# Patient Record
Sex: Male | Born: 1965 | Hispanic: No | Marital: Single | Smoking: Never smoker
Health system: Southern US, Community
[De-identification: ages and names within clinical notes are randomized; demographics above are authoritative.]

## PROBLEM LIST (undated history)

## (undated) ENCOUNTER — Ambulatory Visit: Admission: EM | Payer: Self-pay | Source: Home / Self Care

## (undated) DIAGNOSIS — G44229 Chronic tension-type headache, not intractable: Secondary | ICD-10-CM

## (undated) HISTORY — PX: NO PAST SURGERIES: SHX2092

## (undated) HISTORY — DX: Chronic tension-type headache, not intractable: G44.229

---

## 2007-08-15 ENCOUNTER — Ambulatory Visit: Payer: Self-pay | Admitting: Internal Medicine

## 2011-12-27 ENCOUNTER — Ambulatory Visit (INDEPENDENT_AMBULATORY_CARE_PROVIDER_SITE_OTHER): Payer: BC Managed Care – PPO | Admitting: Internal Medicine

## 2011-12-27 ENCOUNTER — Other Ambulatory Visit (INDEPENDENT_AMBULATORY_CARE_PROVIDER_SITE_OTHER): Payer: BC Managed Care – PPO

## 2011-12-27 ENCOUNTER — Encounter: Payer: Self-pay | Admitting: Internal Medicine

## 2011-12-27 VITALS — BP 120/72 | HR 49 | Temp 98.3°F | Ht 63.0 in | Wt 134.0 lb

## 2011-12-27 DIAGNOSIS — Z0000 Encounter for general adult medical examination without abnormal findings: Secondary | ICD-10-CM

## 2011-12-27 DIAGNOSIS — G44209 Tension-type headache, unspecified, not intractable: Secondary | ICD-10-CM

## 2011-12-27 LAB — CBC WITH DIFFERENTIAL/PLATELET
Basophils Relative: 0.6 % (ref 0.0–3.0)
Eosinophils Absolute: 0.1 10*3/uL (ref 0.0–0.7)
MCHC: 33.6 g/dL (ref 30.0–36.0)
MCV: 88 fl (ref 78.0–100.0)
Monocytes Absolute: 0.5 10*3/uL (ref 0.1–1.0)
Neutrophils Relative %: 50.9 % (ref 43.0–77.0)
RBC: 5.28 Mil/uL (ref 4.22–5.81)
RDW: 13.3 % (ref 11.5–14.6)

## 2011-12-27 LAB — BASIC METABOLIC PANEL
BUN: 15 mg/dL (ref 6–23)
CO2: 30 mEq/L (ref 19–32)
Chloride: 103 mEq/L (ref 96–112)
Creatinine, Ser: 1.1 mg/dL (ref 0.4–1.5)
Glucose, Bld: 106 mg/dL — ABNORMAL HIGH (ref 70–99)

## 2011-12-27 LAB — URINALYSIS, ROUTINE W REFLEX MICROSCOPIC
Hgb urine dipstick: NEGATIVE
Ketones, ur: NEGATIVE
Urine Glucose: NEGATIVE
Urobilinogen, UA: 0.2 (ref 0.0–1.0)

## 2011-12-27 LAB — HEPATIC FUNCTION PANEL
Albumin: 4.4 g/dL (ref 3.5–5.2)
Alkaline Phosphatase: 58 U/L (ref 39–117)
Bilirubin, Direct: 0.1 mg/dL (ref 0.0–0.3)
Total Bilirubin: 1.3 mg/dL — ABNORMAL HIGH (ref 0.3–1.2)

## 2011-12-27 LAB — LIPID PANEL
HDL: 43.2 mg/dL (ref >39.00)
Total CHOL/HDL Ratio: 4

## 2011-12-27 LAB — TSH: TSH: 1.62 u[IU]/mL (ref 0.35–5.50)

## 2011-12-27 LAB — PSA: PSA: 1.45 ng/mL (ref 0.10–4.00)

## 2011-12-27 NOTE — Patient Instructions (Signed)
It was good to see you today. We have reviewed your prior records including labs and tests today Health Maintenance reviewed - all recommended immunizations and age-appropriate screenings are up-to-date. Test(s) ordered today. Your results will be called to you after review (48-72hours after test completion). If any changes need to be made, you will be notified at that time. Please schedule followup in 1-2 years for medical physical and labs, call sooner if problems.  Preventive Care for Adults, Male A healthy lifestyle and preventative care can promote health and wellness. Preventative health guidelines for men include the following key practices:  A routine yearly physical is a good way to check with your caregiver about your health and preventative screening. It is a chance to share any concerns and updates on your health, and to receive a thorough exam.   Visit your dentist for a routine exam and preventative care every 6 months. Brush your teeth twice a day and floss once a day. Good oral hygiene prevents tooth decay and gum disease.   The frequency of eye exams is based on your age, health, family medical history, use of contact lenses, and other factors. Follow your caregiver's recommendations for frequency of eye exams.   Eat a healthy diet. Foods like vegetables, fruits, whole grains, low-fat dairy products, and lean protein foods contain the nutrients you need without too many calories. Decrease your intake of foods high in solid fats, added sugars, and salt. Eat the right amount of calories for you. Get information about a proper diet from your caregiver, if necessary.   Regular physical exercise is one of the most important things you can do for your health. Most adults should get at least 150 minutes of moderate-intensity exercise (any activity that increases your heart rate and causes you to sweat) each week. In addition, most adults need muscle-strengthening exercises on 2 or more  days a week.   Maintain a healthy weight. The body mass index (BMI) is a screening tool to identify possible weight problems. It provides an estimate of body fat based on height and weight. Your caregiver can help determine your BMI, and can help you achieve or maintain a healthy weight. For adults 20 years and older:   A BMI below 18.5 is considered underweight.   A BMI of 18.5 to 24.9 is normal.   A BMI of 25 to 29.9 is considered overweight.   A BMI of 30 and above is considered obese.   Maintain normal blood lipids and cholesterol levels by exercising and minimizing your intake of saturated fat. Eat a balanced diet with plenty of fruit and vegetables. Blood tests for lipids and cholesterol should begin at age 62 and be repeated every 5 years. If your lipid or cholesterol levels are high, you are over 50, or you are a high risk for heart disease, you may need your cholesterol levels checked more frequently. Ongoing high lipid and cholesterol levels should be treated with medicines if diet and exercise are not effective.   If you smoke, find out from your caregiver how to quit. If you do not use tobacco, do not start.   If you choose to drink alcohol, do not exceed 2 drinks per day. One drink is considered to be 12 ounces (355 mL) of beer, 5 ounces (148 mL) of wine, or 1.5 ounces (44 mL) of liquor.   Avoid use of street drugs. Do not share needles with anyone. Ask for help if you need support or instructions about  stopping the use of drugs.   High blood pressure causes heart disease and increases the risk of stroke. Your blood pressure should be checked at least every 1 to 2 years. Ongoing high blood pressure should be treated with medicines, if weight loss and exercise are not effective.   If you are 58 to 44 years old, ask your caregiver if you should take aspirin to prevent heart disease.   Diabetes screening involves taking a blood sample to check your fasting blood sugar level. This  should be done once every 3 years, after age 14, if you are within normal weight and without risk factors for diabetes. Testing should be considered at a younger age or be carried out more frequently if you are overweight and have at least 1 risk factor for diabetes.   Colorectal cancer can be detected and often prevented. Most routine colorectal cancer screening begins at the age of 11 and continues through age 63. However, your caregiver may recommend screening at an earlier age if you have risk factors for colon cancer. On a yearly basis, your caregiver may provide home test kits to check for hidden blood in the stool. Use of a small camera at the end of a tube, to directly examine the colon (sigmoidoscopy or colonoscopy), can detect the earliest forms of colorectal cancer. Talk to your caregiver about this at age 60, when routine screening begins.  Direct examination of the colon should be repeated every 5 to 10 years through age 1, unless early forms of pre-cancerous polyps or small growths are found.   Hepatitis C blood testing is recommended for all people born from [birth year] through 1965 and any individual with known risks for hepatitis C.   Practice safe sex. Use condoms and avoid high-risk sexual practices to reduce the spread of sexually transmitted infections (STIs). STIs include gonorrhea, chlamydia, syphilis, trichomonas, herpes, HPV, and human immunodeficiency virus (HIV). Herpes, HIV, and HPV are viral illnesses that have no cure. They can result in disability, cancer, and death.   A one-time screening for abdominal aortic aneurysm (AAA) and surgical repair of large AAAs by sound wave imaging (ultrasonography) is recommended for ages 101 to 70 years who are current or former smokers.   Healthy men should no longer receive prostate-specific antigen (PSA) blood tests as part of routine cancer screening. Consult with your caregiver about prostate cancer screening.   Testicular cancer screening is  not recommended for adult males who have no symptoms. Screening includes self-exam, caregiver exam, and other screening tests. Consult with your caregiver about any symptoms you have or any concerns you have about testicular cancer.   Use sunscreen with skin protection factor (SPF) of 30 or more. Apply sunscreen liberally and repeatedly throughout the day. You should seek shade when your shadow is shorter than you. Protect yourself by wearing long sleeves, pants, a wide-brimmed hat, and sunglasses year round, whenever you are outdoors.   Once a month, do a whole body skin exam, using a mirror to look at the skin on your back. Notify your caregiver of new moles, moles that have irregular borders, moles that are larger than a pencil eraser, or moles that have changed in shape or color.   Stay current with required immunizations.   Influenza. You need a dose every fall (or winter). The composition of the flu vaccine changes each year, so being vaccinated once is not enough.   Pneumococcal polysaccharide. You need 1 to 2 doses if you smoke cigarettes or  if you have certain chronic medical conditions. You need 1 dose at age 33 (or older) if you have never been vaccinated.   Tetanus, diphtheria, pertussis (Tdap, Td). Get 1 dose of Tdap vaccine if you are younger than age 91 years, are over 36 and have contact with an infant, are a Research scientist (physical sciences), or simply want to be protected from whooping cough. After that, you need a Td booster dose every 10 years. Consult your caregiver if you have not had at least 3 tetanus and diphtheria-containing shots sometime in your life or have a deep or dirty wound.   HPV. This vaccine is recommended for males 13 through 44 years of age. This vaccine may be given to men 22 through 44 years of age who have not completed the 3 dose series. It is recommended for men through age 38 who have sex with men or whose immune system is weakened because of HIV infection, other illness,  or medications. The vaccine is given in 3 doses over 6 months.   Measles, mumps, rubella (MMR). You need at least 1 dose of MMR if you were born in 1957 or later. You may also need a 2nd dose.   Meningococcal. If you are age 11 to 78 years and a Orthoptist living in a residence hall, or have one of several medical conditions, you need to get vaccinated against meningococcal disease. You may also need additional booster doses.   Zoster (shingles). If you are age 70 years or older, you should get this vaccine.   Varicella (chickenpox). If you have never had chickenpox or you were vaccinated but received only 1 dose, talk to your caregiver to find out if you need this vaccine.   Hepatitis A. You need this vaccine if you have a specific risk factor for hepatitis A virus infection, or you simply wish to be protected from this disease. The vaccine is usually given as 2 doses, 6 to 18 months apart.   Hepatitis B. You need this vaccine if you have a specific risk factor for hepatitis B virus infection or you simply wish to be protected from this disease. The vaccine is given in 3 doses, usually over 6 months.  Preventative Service / Frequency Ages 59 to 31  Blood pressure check.** / Every 1 to 2 years.   Lipid and cholesterol check.** / Every 5 years beginning at age 17.   Hepatitis C blood test.** / For any individual with known risks for hepatitis C.   Skin self-exam. / Monthly.   Influenza immunization.** / Every year.   Pneumococcal polysaccharide immunization.** / 1 to 2 doses if you smoke cigarettes or if you have certain chronic medical conditions.   Tetanus, diphtheria, pertussis (Tdap,Td) immunization. / A one-time dose of Tdap vaccine. After that, you need a Td booster dose every 10 years.   HPV immunization. / 3 doses over 6 months, if 26 and younger.   Measles, mumps, rubella (MMR) immunization. / You need at least 1 dose of MMR if you were born in 1957 or later.  You may also need a 2nd dose.   Meningococcal immunization. / 1 dose if you are age 48 to 54 years and a Orthoptist living in a residence hall, or have one of several medical conditions, you need to get vaccinated against meningococcal disease. You may also need additional booster doses.   Varicella immunization.** / Consult your caregiver.   Hepatitis A immunization.** / Consult your caregiver. 2  doses, 6 to 18 months apart.   Hepatitis B immunization.** / Consult your caregiver. 3 doses usually over 6 months.  Ages 76 to 22  Blood pressure check.** / Every 1 to 2 years.   Lipid and cholesterol check.** / Every 5 years beginning at age 28.   Fecal occult blood test (FOBT) of stool. / Every year beginning at age 30 and continuing until age 24. You may not have to do this test if you get colonoscopy every 10 years.   Flexible sigmoidoscopy** or colonoscopy.** / Every 5 years for a flexible sigmoidoscopy or every 10 years for a colonoscopy beginning at age 32 and continuing until age 68.   Hepatitis C blood test.** / For all people born from [birth year] through 1965 and any individual with known risks for hepatitis C.   Skin self-exam. / Monthly.   Influenza immunization.** / Every year.   Pneumococcal polysaccharide immunization.** / 1 to 2 doses if you smoke cigarettes or if you have certain chronic medical conditions.   Tetanus, diphtheria, pertussis (Tdap/Td) immunization.** / A one-time dose of Tdap vaccine. After that, you need a Td booster dose every 10 years.   Measles, mumps, rubella (MMR) immunization.  / You need at least 1 dose of MMR if you were born in 1957 or later. You may also need a 2nd dose.   Varicella immunization.**/ Consult your caregiver.   Meningococcal immunization.** / Consult your caregiver.   Hepatitis A immunization.** / Consult your caregiver. 2 doses, 6 to 18 months apart.   Hepatitis B immunization.** / Consult your caregiver. 3 doses,  usually over 6 months.  Ages 51 and over  Blood pressure check.** / Every 1 to 2 years.   Lipid and cholesterol check.**/ Every 5 years beginning at age 7.   Fecal occult blood test (FOBT) of stool. / Every year beginning at age 76 and continuing until age 59. You may not have to do this test if you get colonoscopy every 10 years.   Flexible sigmoidoscopy** or colonoscopy.** / Every 5 years for a flexible sigmoidoscopy or every 10 years for a colonoscopy beginning at age 52 and continuing until age 85.   Hepatitis C blood test.** / For all people born from [birth year] through 1965 and any individual with known risks for hepatitis C.   Abdominal aortic aneurysm (AAA) screening.** / A one-time screening for ages 24 to 65 years who are current or former smokers.   Skin self-exam. / Monthly.   Influenza immunization.** / Every year.   Pneumococcal polysaccharide immunization.** / 1 dose at age 18 (or older) if you have never been vaccinated.   Tetanus, diphtheria, pertussis (Tdap, Td) immunization. / A one-time dose of Tdap vaccine if you are over 65 and have contact with an infant, are a Research scientist (physical sciences), or simply want to be protected from whooping cough. After that, you need a Td booster dose every 10 years.   Varicella immunization. ** / Consult your caregiver.   Meningococcal immunization.** / Consult your caregiver.   Hepatitis A immunization. ** / Consult your caregiver. 2 doses, 6 to 18 months apart.   Hepatitis B immunization.** / Check with your caregiver. 3 doses, usually over 6 months.  **Family history and personal history of risk and conditions may change your caregiver's recommendations. Document Released: 08/08/2001 Document Revised: 06/01/2011 Document Reviewed: 11/07/2010 [facility] Patient Information [address], [place].

## 2011-12-27 NOTE — Progress Notes (Signed)
  Subjective:    Patient ID: Nathaniel Guzman, male    DOB: 1966-05-24, 46 y.o.   MRN: [id number]  HPI  New pt to me and our practice, here today to establish care patient is here today for annual physical. Patient feels well overall.  Concerned about occassional headaches - none present at this time Onset > 10 years ago ("after marriage") Head pain located posterior head Pain 2-3/10 at worst Describes as throbbing or pounding sensation Occurs on only weekends - coming home from work Friday PM relieved with sleep/nap  No fever, vision change, numbness, seizure or tenderness to touch No radiation of painNo hx remote or acute trauma  Past Medical History  Diagnosis Date  . Tension headache, chronic    Family History  Problem Relation Age of Onset  . Lung cancer Other    History  Substance Use Topics  . Smoking status: Never Smoker   . Smokeless tobacco: Not on file  . Alcohol Use: Yes    Review of Systems Constitutional: Negative for fever or weight change.  Respiratory: Negative for cough and shortness of breath.   Cardiovascular: Negative for chest pain or palpitations.  Gastrointestinal: Negative for abdominal pain, no bowel changes.  Musculoskeletal: Negative for gait problem or joint swelling.  Skin: Negative for rash.  Neurological: Negative for dizziness - occasional posterior headache on Fri PM, relieved with sleep.  No other specific complaints in a complete review of systems (except as listed in HPI above).     Objective:   Physical Exam BP 120/72  Pulse 49  Temp 98.3 F (36.8 C) (Oral)  Ht 5\' 3"  (1.6 m)  Wt 134 lb (60.782 kg)  BMI 23.74 kg/m2  SpO2 97% Wt Readings from Last 3 Encounters:  12/27/11 134 lb (60.782 kg)   Constitutional:  He is [place], appears well-developed and well-nourished. No distress.  HENT: NCAT, non tender sinus - OP clear, nares claer Eyes: PERRL, EOMI - no icterus Neck: Normal range of motion. Neck supple. No JVD or LAD present. No  thyromegaly present.  Cardiovascular: Normal rate, regular rhythm and normal heart sounds.  No murmur heard. no BLE edema Pulmonary/Chest: Effort normal and breath sounds normal. No respiratory distress. no wheezes.  Abdominal: Soft. Bowel sounds are normal. Patient exhibits no distension. There is no tenderness.  Musculoskeletal: Normal range of motion. Patient exhibits no gross deformities.  Neurological: he is alert and oriented to person, place, and time. No cranial nerve deficit. Coordination normal.  Skin: Skin is warm and dry.  No erythema or ulceration.  Psychiatric: he has a normal mood and affect. behavior is normal. Judgment and thought content normal.   No results found for this basename: WBC,  HGB,  HCT,  PLT,  GLUCOSE,  CHOL,  TRIG,  HDL,  LDLDIRECT,  LDLCALC,  ALT,  AST,  NA,  K,  CL,  CREATININE,  BUN,  CO2,  TSH,  PSA,  INR,  GLUF,  HGBA1C,  MICROALBUR       Assessment & Plan:  CPX/v70.0 - Patient has been counseled on age-appropriate routine health concerns for screening and prevention. These are reviewed and up-to-date. Immunizations are up-to-date or declined. Labs ordered and will be reviewed.  Tension headache, chronic - neuro exam and hx benign - reassurance provided - pt will call if symptoms worse or pattern of headache changes

## 2012-09-24 ENCOUNTER — Telehealth: Payer: Self-pay | Admitting: *Deleted

## 2012-09-24 DIAGNOSIS — Z125 Encounter for screening for malignant neoplasm of prostate: Secondary | ICD-10-CM

## 2012-09-24 DIAGNOSIS — Z0000 Encounter for general adult medical examination without abnormal findings: Secondary | ICD-10-CM

## 2012-09-24 NOTE — Telephone Encounter (Signed)
Message copied by Deatra James on Tue Sep 24, 2012  9:49 AM ------      Message from: Etheleen Sia      Created: Tue Sep 24, 2012  9:25 AM      Regarding: LAB       PHYSICAL IN [place] ------

## 2012-09-24 NOTE — Telephone Encounter (Signed)
Received staff msg pt made cpx for July. Entering cpx labs.Marland Kitchenlmb

## 2012-12-26 ENCOUNTER — Encounter: Payer: BC Managed Care – PPO | Admitting: Internal Medicine

## 2012-12-31 ENCOUNTER — Encounter: Payer: BC Managed Care – PPO | Admitting: Internal Medicine

## 2013-06-03 ENCOUNTER — Ambulatory Visit (INDEPENDENT_AMBULATORY_CARE_PROVIDER_SITE_OTHER): Payer: BC Managed Care – PPO | Admitting: Physician Assistant

## 2013-06-03 VITALS — BP 118/70 | HR 70 | Temp 98.6°F | Resp 16 | Ht 63.0 in | Wt 130.0 lb

## 2013-06-03 DIAGNOSIS — J40 Bronchitis, not specified as acute or chronic: Secondary | ICD-10-CM

## 2013-06-03 DIAGNOSIS — R059 Cough, unspecified: Secondary | ICD-10-CM

## 2013-06-03 DIAGNOSIS — J309 Allergic rhinitis, unspecified: Secondary | ICD-10-CM

## 2013-06-03 DIAGNOSIS — R05 Cough: Secondary | ICD-10-CM

## 2013-06-03 MED ORDER — MONTELUKAST SODIUM 10 MG PO TABS: 10.0000 mg | ORAL_TABLET | Freq: Every day | ORAL | Status: DC

## 2013-06-03 MED ORDER — PREDNISONE 20 MG PO TABS: ORAL_TABLET | ORAL | Status: DC

## 2013-06-03 MED ORDER — ALBUTEROL SULFATE HFA 108 (90 BASE) MCG/ACT IN AERS: 2.0000 | INHALATION_SPRAY | RESPIRATORY_TRACT | Status: DC | PRN

## 2013-06-03 MED ORDER — AZITHROMYCIN 250 MG PO TABS: ORAL_TABLET | ORAL | Status: DC

## 2013-06-03 MED ORDER — BENZONATATE 200 MG PO CAPS: 200.0000 mg | ORAL_CAPSULE | Freq: Three times a day (TID) | ORAL | Status: DC | PRN

## 2013-06-03 NOTE — Progress Notes (Signed)
Patient ID: Nathaniel Guzman MRN: [id number], DOB: August 20, 1965, 47 y.o. Date of Encounter: 06/03/2013, 5:40 PM  Primary Physician: Rene Paci, MD  Chief Complaint:  Chief Complaint  Patient presents with  . Cough    1 week  . Sore Throat    HPI: 47 y.o. male presents with a 7 day history of nasal congestion, post nasal drip, sore throat, and cough. Afebrile. No chills. Nasal congestion thick and green/yellow. Cough is productive of green/yellow sputum and not associated with time of day. No SOB or wheezing. Has tried OTC cold preps without success. No GI complaints. Appetite normal. Had a very similar cough in 2009 that was treated with Singulair successfully.    No sick contacts, recent antibiotics, or recent travels.   No leg trauma, sedentary periods, h/o cancer, or tobacco use.  Past Medical History  Diagnosis Date  . Tension headache, chronic      Home Meds: Prior to Admission medications   Medication Sig Start Date End Date Taking? Authorizing Provider  Multiple Vitamin (MULTIVITAMIN) tablet Take 1 tablet by mouth daily.   Yes Historical Provider, MD                                       Allergies:  Allergies  Allergen Reactions  . Hydrocodone Nausea And Vomiting  . Sulfa Antibiotics     History   Social History  . Marital Status: Single    Spouse Name: N/A    Number of Children: N/A  . Years of Education: N/A   Occupational History  . Not on file.   Social History Main Topics  . Smoking status: Never Smoker   . Smokeless tobacco: Not on file  . Alcohol Use: Yes  . Drug Use: No  . Sexual Activity: Not on file   Other Topics Concern  . Not on file   Social History Narrative  . No narrative on file     Review of Systems: Constitutional: positive for fatigue. negative for chills or fever HEENT: see above Cardiovascular: negative for chest pain or palpitations Respiratory: positive for cough. negative for wheezing, or shortness of  breath Abdominal: negative for abdominal pain, nausea, vomiting or diarrhea Dermatological: negative for rash Neurologic: negative for headache   Physical Exam: Blood pressure 118/70, pulse 70, temperature 98.6 F (37 C), resp. rate 16, height 5\' 3"  (1.6 m), weight 130 lb (58.968 kg), SpO2 96.00%., Body mass index is 23.03 kg/(m^2). General: Well developed, well nourished, in no acute distress. Head: Normocephalic, atraumatic, eyes without discharge, sclera non-icteric, nares are congested. Bilateral auditory canals clear, TM's are without perforation, pearly grey with reflective cone of light bilaterally. No sinus TTP. Oral cavity moist, dentition normal. Posterior pharynx with post nasal drip and mild erythema. No peritonsillar abscess or tonsillar exudate. Uvula midline.  Neck: Supple. No thyromegaly. Full ROM. No lymphadenopathy. Lungs: Coarse breath sounds bilaterally without wheezes, rales, or rhonchi. Breathing is unlabored.  Heart: RRR with S1 S2. No murmurs, rubs, or gallops appreciated. Msk:  Strength and tone normal for age. Extremities: No clubbing or cyanosis. No edema. Neuro: Alert and oriented X 3. Moves all extremities spontaneously. CNII-XII grossly in tact. Psych:  Responds to questions appropriately with a normal affect.     ASSESSMENT AND PLAN:  47 y.o. male with bronchitis, bronchospasm, cough, and allergies.  1) Bronchitis/bronchospasm/cough  -Azithromycin 250 MG #6 2 po first day  then 1 po next 4 days no RF -Prednisone 20 mg #18 3x3, 2x3, 1x3 no RF -Proventil 2 puffs inhaled q 4-6 hours prn #1 RF 1 -Tessalon Perles 200 mg 1 po tid prn cough #60 no RF  -Mucinex -Tylenol/Motrin prn -Rest/fluids -RTC precautions -RTC 3-5 days if no improvement  2) Allergies -Singulair 10 mg 1 po qhs #30 RF 5, this has helped him previously -Continue OTC Claritin   Signed, Eula Listen, PA-C [facility] [place], [place] [address] [phone] 06/03/2013 5:40  PM

## 2013-06-10 ENCOUNTER — Ambulatory Visit (INDEPENDENT_AMBULATORY_CARE_PROVIDER_SITE_OTHER): Payer: BC Managed Care – PPO | Admitting: Physician Assistant

## 2013-06-10 ENCOUNTER — Ambulatory Visit: Payer: BC Managed Care – PPO

## 2013-06-10 VITALS — BP 118/72 | HR 76 | Temp 98.1°F | Resp 18 | Ht 63.0 in | Wt 133.0 lb

## 2013-06-10 DIAGNOSIS — R05 Cough: Secondary | ICD-10-CM

## 2013-06-10 DIAGNOSIS — R059 Cough, unspecified: Secondary | ICD-10-CM

## 2013-06-10 DIAGNOSIS — K219 Gastro-esophageal reflux disease without esophagitis: Secondary | ICD-10-CM

## 2013-06-10 DIAGNOSIS — J387 Other diseases of larynx: Secondary | ICD-10-CM

## 2013-06-10 LAB — POCT CBC
Granulocyte percent: 59.5 %G (ref 37–80)
HCT, POC: 47 % (ref 43.5–53.7)
Hemoglobin: 14.6 g/dL (ref 14.1–18.1)
Lymph, poc: 3.7 — AB (ref 0.6–3.4)
MID (cbc): 0.6 (ref 0–0.9)
MPV: 8.9 fL (ref 0–99.8)
POC Granulocyte: 6.4 (ref 2–6.9)
POC MID %: 5.8 %M (ref 0–12)
Platelet Count, POC: 239 10*3/uL (ref 142–424)
RBC: 4.99 M/uL (ref 4.69–6.13)

## 2013-06-10 MED ORDER — ESOMEPRAZOLE MAGNESIUM 40 MG PO CPDR: 40.0000 mg | DELAYED_RELEASE_CAPSULE | Freq: Every day | ORAL | Status: DC

## 2013-06-10 MED ORDER — GUAIFENESIN-CODEINE 100-10 MG/5ML PO SOLN: ORAL | Status: DC

## 2013-06-10 NOTE — Progress Notes (Signed)
Patient ID: Nathaniel Guzman MRN: [id number], DOB: Feb 06, 1966, 47 y.o. Date of Encounter: 06/10/2013, 6:32 PM  Primary Physician: Rene Paci, MD  Chief Complaint: Follow up  HPI: 47 y.o. male with history below presents for follow up of cough. Patient initially presented on 06/03/13 with a 1 week history of productive cough. He was started on Z pack, prednisone 20 mg 3x3, 2x3, 1x3, Proventil, Tessalon, and Singulair. He states his cough is no better. Still coughing just as hard. Cough is sometimes productive and sometimes not productive. He sometimes feels like there is something along his throat/esophagus causing his cough. His cough is worse during the day and improves at nighttime. No SOB or wheezing. He denies any heartburn symptoms.    Past Medical History  Diagnosis Date  . Tension headache, chronic      Home Meds: Prior to Admission medications   Medication Sig Start Date End Date Taking? Authorizing Provider  montelukast (SINGULAIR) 10 MG tablet Take 1 tablet (10 mg total) by mouth at bedtime. 06/03/13  Yes Hoby Kawai M Jemari Hallum, PA-C  predniSONE (DELTASONE) 20 MG tablet 3 PO FOR 3 DAYS, 2 PO FOR 3 DAYS, 1 PO FOR 3 DAYS 06/03/13  Yes Laporsha Grealish M Lan Entsminger, PA-C  albuterol (PROVENTIL HFA;VENTOLIN HFA) 108 (90 BASE) MCG/ACT inhaler Inhale 2 puffs into the lungs every 4 (four) hours as needed for wheezing or shortness of breath. 06/03/13  No Luke Falero M Marlea Gambill, PA-C                Multiple Vitamin (MULTIVITAMIN) tablet Take 1 tablet by mouth daily.   No Historical Provider, MD    Allergies:  Allergies  Allergen Reactions  . Hydrocodone Nausea And Vomiting  . Sulfa Antibiotics     History   Social History  . Marital Status: Single    Spouse Name: N/A    Number of Children: N/A  . Years of Education: N/A   Occupational History  . Not on file.   Social History Main Topics  . Smoking status: Never Smoker   . Smokeless tobacco: Not on file  . Alcohol Use: Yes  . Drug Use: No  . Sexual Activity:  Not on file   Other Topics Concern  . Not on file   Social History Narrative  . No narrative on file     Review of Systems: Constitutional: negative for chills, fever, or fatigue  HEENT: positive for congestion and rhinorrhea. negative for vision changes, hearing loss, ST, or sinus pressure Cardiovascular: negative for chest pain or palpitations Respiratory: positive for cough. negative for wheezing, or shortness of breath Abdominal: negative for abdominal pain, reflux, nausea, vomiting, diarrhea, or constipation Dermatological: negative for rash Neurologic: negative for headache, dizziness, or syncope   Physical Exam: Blood pressure 118/72, pulse 76, temperature 98.1 F (36.7 C), temperature source Oral, resp. rate 18, height 5\' 3"  (1.6 m), weight 133 lb (60.328 kg), SpO2 98.00%., Body mass index is 23.57 kg/(m^2). General: Well developed, well nourished, in no acute distress. Head: Normocephalic, atraumatic, eyes without discharge, sclera non-icteric, nares are congested. Bilateral auditory canals clear, TM's are without perforation, pearly grey and translucent with reflective cone of light bilaterally. Oral cavity moist, posterior pharynx with post nasal drip. No exudate, erythema, or peritonsillar abscess. Uvula midline. Neck: Supple. No thyromegaly. Full ROM. No lymphadenopathy. Lungs: Clear bilaterally to auscultation without wheezes, rales, or rhonchi. Breathing is unlabored. Heart: RRR with S1 S2. No murmurs, rubs, or gallops appreciated. Msk:  Strength and tone normal for  age. Extremities/Skin: Warm and dry. No clubbing or cyanosis. No edema. No rashes or suspicious lesions. Neuro: Alert and oriented X 3. Moves all extremities spontaneously. Gait is normal. CNII-XII grossly in tact. Psych:  Responds to questions appropriately with a normal affect.   Labs: Results for orders placed in visit on 06/10/13  POCT CBC      Result Value Range   WBC 10.8 (*) 4.6 - 10.2 K/uL    Lymph, poc 3.7 (*) 0.6 - 3.4   POC LYMPH PERCENT 34.7  10 - 50 %L   MID (cbc) 0.6  0 - 0.9   POC MID % 5.8  0 - 12 %M   POC Granulocyte 6.4  2 - 6.9   Granulocyte percent 59.5  37 - 80 %G   RBC 4.99  4.69 - 6.13 M/uL   Hemoglobin 14.6  14.1 - 18.1 g/dL   HCT, POC 16.1  09.6 - 53.7 %   MCV 94.2  80 - 97 fL   MCH, POC 29.3  27 - 31.2 pg   MCHC 31.1 (*) 31.8 - 35.4 g/dL   RDW, POC 04.5     Platelet Count, POC 239  142 - 424 K/uL   MPV 8.9  0 - 99.8 fL   ACE pending  CXR:  [facility] reading (PRIMARY) by  Dr. Milus Glazier. Increased perihilar markings without discrete infiltrate     ASSESSMENT AND PLAN:  47 y.o. male with possible LPR and cough -Trial of Nexium 40 mg 1 po daily #30 RF 3 -Robitussin AC 1/2 tsp po q 4-6 hours prn cough #120 mL no RF, SED, Take on full stomach. Discussed intolerance  -Await lab -If symptoms persist plan to refer to pulmonology  -Discussed with Dr. Milus Glazier    Signed, Eula Listen, PA-C [facility] [place], [place] [address] [phone] 06/10/2013 6:32 PM

## 2013-06-11 LAB — COMPREHENSIVE METABOLIC PANEL
ALT: 16 U/L (ref 0–53)
AST: 16 U/L (ref 0–37)
Albumin: 4.3 g/dL (ref 3.5–5.2)
Alkaline Phosphatase: 67 U/L (ref 39–117)
BUN: 21 mg/dL (ref 6–23)
Calcium: 8.9 mg/dL (ref 8.4–10.5)
Chloride: 102 mEq/L (ref 96–112)
Potassium: 3.9 mEq/L (ref 3.5–5.3)
Sodium: 141 mEq/L (ref 135–145)
Total Protein: 7.1 g/dL (ref 6.0–8.3)

## 2013-06-11 LAB — ANGIOTENSIN CONVERTING ENZYME: Angiotensin-Converting Enzyme: 30 U/L (ref 8–52)

## 2013-10-27 ENCOUNTER — Other Ambulatory Visit (INDEPENDENT_AMBULATORY_CARE_PROVIDER_SITE_OTHER): Payer: BC Managed Care – PPO

## 2013-10-27 ENCOUNTER — Encounter: Payer: Self-pay | Admitting: Internal Medicine

## 2013-10-27 ENCOUNTER — Ambulatory Visit (INDEPENDENT_AMBULATORY_CARE_PROVIDER_SITE_OTHER): Payer: BC Managed Care – PPO | Admitting: Internal Medicine

## 2013-10-27 VITALS — BP 100/78 | HR 55 | Temp 99.0°F | Ht 63.0 in | Wt 136.8 lb

## 2013-10-27 DIAGNOSIS — Z0000 Encounter for general adult medical examination without abnormal findings: Secondary | ICD-10-CM

## 2013-10-27 LAB — BASIC METABOLIC PANEL
BUN: 16 mg/dL (ref 6–23)
CALCIUM: 9.2 mg/dL (ref 8.4–10.5)
CHLORIDE: 104 meq/L (ref 96–112)
CO2: 29 meq/L (ref 19–32)
CREATININE: 0.9 mg/dL (ref 0.4–1.5)
GFR: 96.47 mL/min (ref >60.00)
GLUCOSE: 97 mg/dL (ref 70–99)
Potassium: 3.9 mEq/L (ref 3.5–5.1)
Sodium: 140 mEq/L (ref 135–145)

## 2013-10-27 LAB — URINALYSIS, ROUTINE W REFLEX MICROSCOPIC
Bilirubin Urine: NEGATIVE
Hgb urine dipstick: NEGATIVE
Ketones, ur: NEGATIVE
Leukocytes, UA: NEGATIVE
Nitrite: NEGATIVE
PH: 6 (ref 5.0–8.0)
RBC / HPF: NONE SEEN (ref 0–?)
TOTAL PROTEIN, URINE-UPE24: NEGATIVE
URINE GLUCOSE: NEGATIVE
Urobilinogen, UA: 0.2 (ref 0.0–1.0)

## 2013-10-27 LAB — HEPATIC FUNCTION PANEL
ALBUMIN: 4.4 g/dL (ref 3.5–5.2)
ALT: 18 U/L (ref 0–53)
AST: 25 U/L (ref 0–37)
Alkaline Phosphatase: 57 U/L (ref 39–117)
BILIRUBIN TOTAL: 0.9 mg/dL (ref 0.3–1.2)
Bilirubin, Direct: 0.1 mg/dL (ref 0.0–0.3)
TOTAL PROTEIN: 7.1 g/dL (ref 6.0–8.3)

## 2013-10-27 LAB — CBC WITH DIFFERENTIAL/PLATELET
BASOS PCT: 0.6 % (ref 0.0–3.0)
Basophils Absolute: 0 10*3/uL (ref 0.0–0.1)
EOS ABS: 0.1 10*3/uL (ref 0.0–0.7)
Eosinophils Relative: 1.1 % (ref 0.0–5.0)
HCT: 45.4 % (ref 39.0–52.0)
Hemoglobin: 15.5 g/dL (ref 13.0–17.0)
Lymphocytes Relative: 33.9 % (ref 12.0–46.0)
Lymphs Abs: 1.9 10*3/uL (ref 0.7–4.0)
MCHC: 34.2 g/dL (ref 30.0–36.0)
MCV: 87 fl (ref 78.0–100.0)
MONO ABS: 0.4 10*3/uL (ref 0.1–1.0)
Monocytes Relative: 7.6 % (ref 3.0–12.0)
Neutro Abs: 3.1 10*3/uL (ref 1.4–7.7)
Neutrophils Relative %: 56.8 % (ref 43.0–77.0)
PLATELETS: 189 10*3/uL (ref 150.0–400.0)
RBC: 5.22 Mil/uL (ref 4.22–5.81)
RDW: 13.7 % (ref 11.5–14.6)
WBC: 5.5 10*3/uL (ref 4.5–10.5)

## 2013-10-27 LAB — PSA: PSA: 1.89 ng/mL (ref 0.10–4.00)

## 2013-10-27 LAB — LIPID PANEL
CHOLESTEROL: 182 mg/dL (ref 0–200)
HDL: 46.2 mg/dL (ref >39.00)
LDL CALC: 118 mg/dL — AB (ref 0–99)
TRIGLYCERIDES: 89 mg/dL (ref 0.0–149.0)
Total CHOL/HDL Ratio: 4
VLDL: 17.8 mg/dL (ref 0.0–40.0)

## 2013-10-27 LAB — TSH: TSH: 1.51 u[IU]/mL (ref 0.35–5.50)

## 2013-10-27 NOTE — Patient Instructions (Addendum)
It was good to see you today.  We have reviewed your prior records including labs and tests today  Health Maintenance reviewed - all recommended immunizations and age-appropriate screenings are up-to-date.  Test(s) ordered today. Your results will be released to MyChart (or called to you) after review, usually within 72hours after test completion. If any changes need to be made, you will be notified at that same time.  Medications reviewed and updated, no changes recommended at this time.  Please schedule followup in 12 months for annual exam and labs, call sooner if problems.  Health Maintenance, Males A healthy lifestyle and preventative care can promote health and wellness.  Maintain regular health, dental, and eye exams.  Eat a healthy diet. Foods like vegetables, fruits, whole grains, low-fat dairy products, and lean protein foods contain the nutrients you need and are low in calories. Decrease your intake of foods high in solid fats, added sugars, and salt. Get information about a proper diet from your health care provider, if necessary.  Regular physical exercise is one of the most important things you can do for your health. Most adults should get at least 150 minutes of moderate-intensity exercise (any activity that increases your heart rate and causes you to sweat) each week. In addition, most adults need muscle-strengthening exercises on 2 or more days a week.   Maintain a healthy weight. The body mass index (BMI) is a screening tool to identify possible weight problems. It provides an estimate of body fat based on height and weight. Your health care provider can find your BMI and can help you achieve or maintain a healthy weight. For males 20 years and older:  A BMI below 18.5 is considered underweight.  A BMI of 18.5 to 24.9 is normal.  A BMI of 25 to 29.9 is considered overweight.  A BMI of 30 and above is considered obese.  Maintain normal blood lipids and cholesterol  by exercising and minimizing your intake of saturated fat. Eat a balanced diet with plenty of fruits and vegetables. Blood tests for lipids and cholesterol should begin at age 20 and be repeated every 5 years. If your lipid or cholesterol levels are high, you are over 50, or you are at high risk for heart disease, you may need your cholesterol levels checked more frequently.Ongoing high lipid and cholesterol levels should be treated with medicines, if diet and exercise are not working.  If you smoke, find out from your health care provider how to quit. If you do not use tobacco, do not start.  Lung cancer screening is recommended for adults aged 55 80 years who are at high risk for developing lung cancer because of a history of smoking. A yearly low-dose CT scan of the lungs is recommended for people who have at least a 30-pack-year history of smoking and are a current smoker or have quit within the past 15 years. A pack year of smoking is smoking an average of 1 pack of cigarettes a day for 1 year (for example, a 30-pack-year history of smoking could mean smoking 1 pack a day for 30 years or 2 packs a day for 15 years). Yearly screening should continue until the smoker has stopped smoking for at least 15 years. Yearly screening should be stopped for people who develop a health problem that would prevent them from having lung cancer treatment.  If you choose to drink alcohol, do not have more than 2 drinks per day. One drink is considered to be   12 oz (360 mL) of beer, 5 oz (150 mL) of wine, or 1.5 oz (45 mL) of liquor.  Avoid use of street drugs. Do not share needles with anyone. Ask for help if you need support or instructions about stopping the use of drugs.  High blood pressure causes heart disease and increases the risk of stroke. Blood pressure should be checked at least every 1 2 years. Ongoing high blood pressure should be treated with medicines if weight loss and exercise are not effective.  If  you are 45 46 years old, ask your health care provider if you should take aspirin to prevent heart disease.  Diabetes screening involves taking a blood sample to check your fasting blood sugar level. This should be done once every 3 years after age 45, if you are at a normal weight and without risk factors for diabetes. Testing should be considered at a younger age or be carried out more frequently if you are overweight and have at least 1 risk factor for diabetes.  Colorectal cancer can be detected and often prevented. Most routine colorectal cancer screening begins at the age of 50 and continues through age 75. However, your health care provider may recommend screening at an earlier age if you have risk factors for colon cancer. On a yearly basis, your health care provider may provide home test kits to check for hidden blood in the stool. A small camera at the end of a tube may be used to directly examine the colon (sigmoidoscopy or colonoscopy) to detect the earliest forms of colorectal cancer. Talk to your health care provider about this at age 50, when routine screening begins. A direct exam of the colon should be repeated every 5 10 years through age 75, unless early forms of pre-cancerous polyps or small growths are found.  People who are at an increased risk for hepatitis B should be screened for this virus. You are considered at high risk for hepatitis B if:  You were born in a country where hepatitis B occurs often. Talk with your health care provider about which countries are considered high-risk.  Your parents were born in a high-risk country and you have not received a shot to protect against hepatitis B (hepatitis B vaccine).  You have HIV or AIDS.  You use needles to inject street drugs.  You live with, or have sex with, someone who has hepatitis B.  You are a man who has sex with other men (MSM).  You get hemodialysis treatment.  You take certain medicines for conditions like  cancer, organ transplantation, and autoimmune conditions.  Hepatitis C blood testing is recommended for all people born from 1945 through 1965 and any individual with known risk factors for hepatitis C.  Healthy men should no longer receive prostate-specific antigen (PSA) blood tests as part of routine cancer screening. Talk to your health care provider about prostate cancer screening.  Testicular cancer screening is not recommended for adolescents or adult males who have no symptoms. Screening includes self-exam, a health care provider exam, and other screening tests. Consult with your health care provider about any symptoms you have or any concerns you have about testicular cancer.  Practice safe sex. Use condoms and avoid high-risk sexual practices to reduce the spread of sexually transmitted infections (STIs).  Use sunscreen. Apply sunscreen liberally and repeatedly throughout the day. You should seek shade when your shadow is shorter than you. Protect yourself by wearing long sleeves, pants, a wide-brimmed hat, and sunglasses   year round, whenever you are outdoors.  Tell your health care provider of new moles or changes in moles, especially if there is a change in shape or color. Also tell your provider if a mole is larger than the size of a pencil eraser.  A one-time screening for abdominal aortic aneurysm (AAA) and surgical repair of large AAAs by ultrasound is recommended for men aged 65 75 years who are current or former smokers.  Stay current with your vaccines (immunizations). Document Released: 12/09/2007 Document Revised: 04/02/2013 Document Reviewed: 11/07/2010 [employer] Patient Information 2014 [employer].  

## 2013-10-27 NOTE — Progress Notes (Signed)
Pre visit review using our clinic review tool, if applicable. No additional management support is needed unless otherwise documented below in the visit note. 

## 2013-10-27 NOTE — Progress Notes (Signed)
   Subjective:    Patient ID: Nathaniel BoughHankyu Uppal, male    DOB: 1965/09/23, 48 y.o.   MRN: [id number]  HPI  patient is here today for annual physical. Patient feels well and has no complaints. Also reviewed chronic medical issues and interval medical events  Past Medical History  Diagnosis Date  . Tension headache, chronic    Family History  Problem Relation Age of Onset  . Lung cancer Cousin    History  Substance Use Topics  . Smoking status: Never Smoker   . Smokeless tobacco: Not on file  . Alcohol Use: Yes    Review of Systems  Constitutional: Positive for fatigue. Negative for fever, activity change, appetite change and unexpected weight change.  Respiratory: Negative for cough, chest tightness, shortness of breath and wheezing.   Cardiovascular: Negative for chest pain, palpitations and leg swelling.  Neurological: Negative for dizziness, weakness and headaches.  Psychiatric/Behavioral: Negative for dysphoric mood. The patient is not nervous/anxious.   All other systems reviewed and are negative.      Objective:   Physical Exam  BP 100/78  Pulse 55  Temp(Src) 99 F (37.2 C) (Oral)  Ht 5\' 3"  (1.6 m)  Wt 136 lb 12.8 oz (62.052 kg)  BMI 24.24 kg/m2  SpO2 97% Wt Readings from Last 3 Encounters:  10/27/13 136 lb 12.8 oz (62.052 kg)  06/10/13 133 lb (60.328 kg)  06/03/13 130 lb (58.968 kg)   Constitutional: he appears well-developed and well-nourished. No distress.  HENT: Head: Normocephalic and atraumatic. Ears: B TMs ok, no erythema or effusion; Nose: Nose normal. Mouth/Throat: Oropharynx is clear and moist. No oropharyngeal exudate.  Eyes: Conjunctivae and EOM are normal. Pupils are equal, round, and reactive to light. No scleral icterus.  Neck: Normal range of motion. Neck supple. No JVD present. No thyromegaly present.  Cardiovascular: Normal rate, regular rhythm and normal heart sounds.  No murmur heard. No BLE edema. Pulmonary/Chest: Effort normal and breath sounds  normal. No respiratory distress. he has no wheezes.  Abdominal: Soft. Bowel sounds are normal. he exhibits no distension. There is no tenderness. no masses Musculoskeletal: Normal range of motion, no joint effusions. No gross deformities Neurological: he is alert and oriented to person, place, and time. No cranial nerve deficit. Coordination, balance, strength, speech and gait are normal.  Skin: Skin is warm and dry. No rash noted. No erythema.  Psychiatric: he has a normal mood and affect. behavior is normal. Judgment and thought content normal.  Lab Results  Component Value Date   WBC 10.8* 06/10/2013   HGB 14.6 06/10/2013   HCT 47.0 06/10/2013   PLT 167.0 12/27/2011   GLUCOSE 96 06/10/2013   CHOL 184 12/27/2011   TRIG 159.0* 12/27/2011   HDL 43.20 12/27/2011   LDLCALC 109* 12/27/2011   ALT 16 06/10/2013   AST 16 06/10/2013   NA 141 06/10/2013   K 3.9 06/10/2013   CL 102 06/10/2013   CREATININE 1.01 06/10/2013   BUN 21 06/10/2013   CO2 31 06/10/2013   TSH 1.62 12/27/2011   PSA 1.45 12/27/2011   ECG: Normal sinus rhythm at 59 beats per minute. No ischemic change or arrhythmia No results found.     Assessment & Plan:   CPX/v70.0 - Patient has been counseled on age-appropriate routine health concerns for screening and prevention. These are reviewed and up-to-date. Immunizations are up-to-date or declined. Labs/ECG ordered and reviewed.

## 2014-06-24 LAB — HEPATIC FUNCTION PANEL
ALT: 13 U/L (ref 10–40)
AST: 18 U/L (ref 14–40)
Alkaline Phosphatase: 48 U/L (ref 25–125)
BILIRUBIN, TOTAL: 1 mg/dL

## 2014-06-24 LAB — HM COLONOSCOPY: HM Colonoscopy: 2014

## 2014-06-24 LAB — BASIC METABOLIC PANEL
BUN: 1 mg/dL — AB (ref 4–21)
Creatinine: 7 mg/dL — AB (ref 0.6–1.3)
Glucose: 80 mg/dL

## 2014-06-24 LAB — LIPID PANEL
Cholesterol: 174 mg/dL (ref 0–200)
HDL: 39 mg/dL (ref 35–70)
LDL CALC: 100 mg/dL
LDl/HDL Ratio: 4.5
Triglycerides: 174 mg/dL — AB (ref 40–160)

## 2014-11-04 ENCOUNTER — Encounter: Payer: BC Managed Care – PPO | Admitting: Internal Medicine

## 2014-12-31 IMAGING — CR DG CHEST 2V
2 series · 2 of 2 positions shown · non-contrast
Comparison: None.

CLINICAL DATA: Cough

EXAM:
CHEST  2 VIEW

[PA]
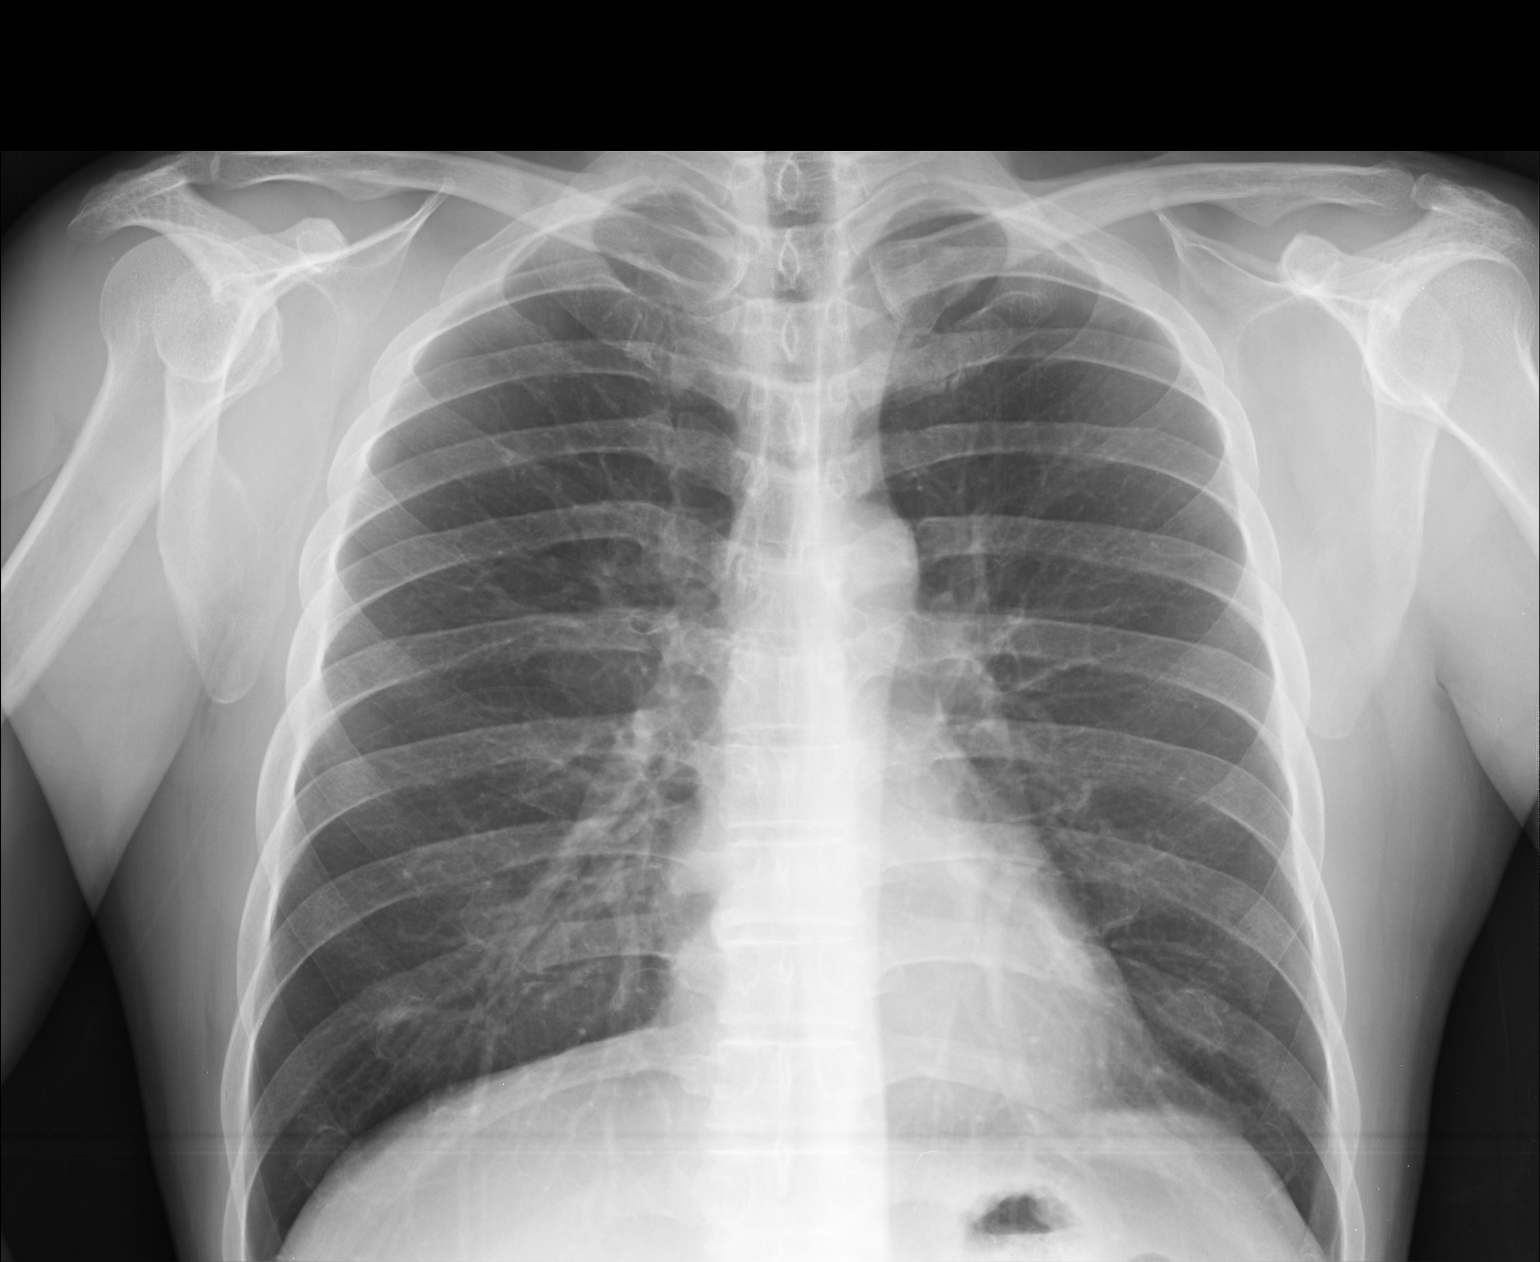

[lateral]
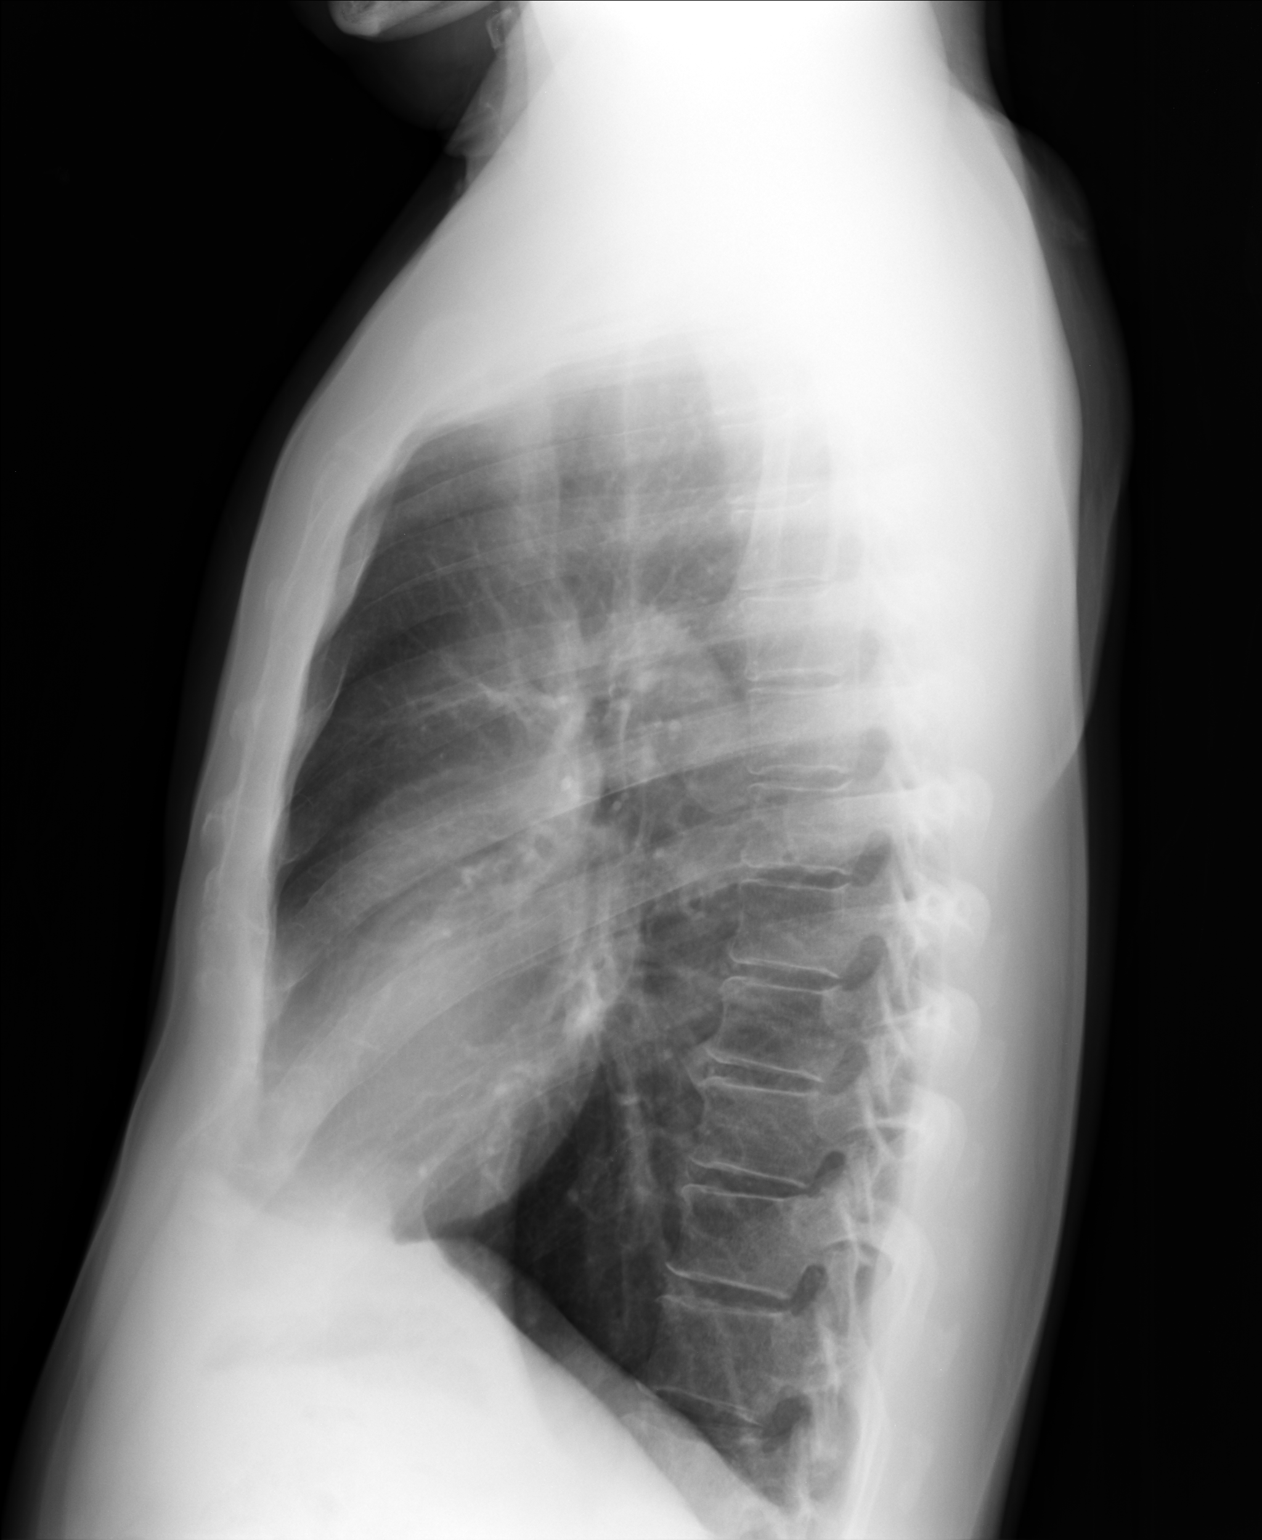

[2 of 2 positions shown; findings below may reference images not displayed]

FINDINGS: The heart size and mediastinal contours are within normal limits.
Both lungs are clear. The visualized skeletal structures are
unremarkable.
IMPRESSION: No active cardiopulmonary disease.

## 2015-02-18 ENCOUNTER — Ambulatory Visit (INDEPENDENT_AMBULATORY_CARE_PROVIDER_SITE_OTHER): Payer: 59 | Admitting: Internal Medicine

## 2015-02-18 ENCOUNTER — Encounter: Payer: Self-pay | Admitting: Internal Medicine

## 2015-02-18 ENCOUNTER — Other Ambulatory Visit (INDEPENDENT_AMBULATORY_CARE_PROVIDER_SITE_OTHER): Payer: 59

## 2015-02-18 VITALS — BP 110/80 | HR 64 | Temp 98.2°F | Ht 63.0 in | Wt 137.8 lb

## 2015-02-18 DIAGNOSIS — B354 Tinea corporis: Secondary | ICD-10-CM

## 2015-02-18 DIAGNOSIS — Z0000 Encounter for general adult medical examination without abnormal findings: Secondary | ICD-10-CM | POA: Diagnosis not present

## 2015-02-18 DIAGNOSIS — R0781 Pleurodynia: Secondary | ICD-10-CM

## 2015-02-18 LAB — LIPID PANEL
Cholesterol: 172 mg/dL (ref 0–200)
HDL: 35.3 mg/dL — ABNORMAL LOW (ref >39.00)
LDL CALC: 102 mg/dL — AB (ref 0–99)
NONHDL: 136.39
Total CHOL/HDL Ratio: 5
Triglycerides: 171 mg/dL — ABNORMAL HIGH (ref 0.0–149.0)
VLDL: 34.2 mg/dL (ref 0.0–40.0)

## 2015-02-18 LAB — HEPATIC FUNCTION PANEL
ALBUMIN: 4.6 g/dL (ref 3.5–5.2)
ALK PHOS: 65 U/L (ref 39–117)
ALT: 16 U/L (ref 0–53)
AST: 18 U/L (ref 0–37)
Bilirubin, Direct: 0.1 mg/dL (ref 0.0–0.3)
Total Bilirubin: 0.5 mg/dL (ref 0.2–1.2)
Total Protein: 7.3 g/dL (ref 6.0–8.3)

## 2015-02-18 LAB — BASIC METABOLIC PANEL
BUN: 17 mg/dL (ref 6–23)
CALCIUM: 9.5 mg/dL (ref 8.4–10.5)
CO2: 31 meq/L (ref 19–32)
Chloride: 103 mEq/L (ref 96–112)
Creatinine, Ser: 1.06 mg/dL (ref 0.40–1.50)
GFR: 79.42 mL/min (ref >60.00)
GLUCOSE: 101 mg/dL — AB (ref 70–99)
Potassium: 4.1 mEq/L (ref 3.5–5.1)
SODIUM: 140 meq/L (ref 135–145)

## 2015-02-18 LAB — URINALYSIS, ROUTINE W REFLEX MICROSCOPIC
Bilirubin Urine: NEGATIVE
Hgb urine dipstick: NEGATIVE
Ketones, ur: NEGATIVE
LEUKOCYTES UA: NEGATIVE
Nitrite: NEGATIVE
RBC / HPF: NONE SEEN (ref 0–?)
SPECIFIC GRAVITY, URINE: 1.015 (ref 1.000–1.030)
TOTAL PROTEIN, URINE-UPE24: NEGATIVE
URINE GLUCOSE: NEGATIVE
Urobilinogen, UA: 0.2 (ref 0.0–1.0)
WBC, UA: NONE SEEN (ref 0–?)
pH: 5.5 (ref 5.0–8.0)

## 2015-02-18 LAB — TSH: TSH: 2.25 u[IU]/mL (ref 0.35–4.50)

## 2015-02-18 LAB — CBC WITH DIFFERENTIAL/PLATELET
BASOS ABS: 0 10*3/uL (ref 0.0–0.1)
Basophils Relative: 0.4 % (ref 0.0–3.0)
Eosinophils Absolute: 0.1 10*3/uL (ref 0.0–0.7)
Eosinophils Relative: 1.7 % (ref 0.0–5.0)
HCT: 47 % (ref 39.0–52.0)
Hemoglobin: 16.1 g/dL (ref 13.0–17.0)
LYMPHS ABS: 2 10*3/uL (ref 0.7–4.0)
Lymphocytes Relative: 26.1 % (ref 12.0–46.0)
MCHC: 34.3 g/dL (ref 30.0–36.0)
MCV: 86.8 fl (ref 78.0–100.0)
MONOS PCT: 7.3 % (ref 3.0–12.0)
Monocytes Absolute: 0.6 10*3/uL (ref 0.1–1.0)
NEUTROS ABS: 5 10*3/uL (ref 1.4–7.7)
NEUTROS PCT: 64.5 % (ref 43.0–77.0)
PLATELETS: 184 10*3/uL (ref 150.0–400.0)
RBC: 5.42 Mil/uL (ref 4.22–5.81)
RDW: 13.3 % (ref 11.5–15.5)
WBC: 7.8 10*3/uL (ref 4.0–10.5)

## 2015-02-18 LAB — PSA: PSA: 1.51 ng/mL (ref 0.10–4.00)

## 2015-02-18 MED ORDER — CLOTRIMAZOLE-BETAMETHASONE 1-0.05 % EX CREA
1.0000 | TOPICAL_CREAM | Freq: Two times a day (BID) | CUTANEOUS | Status: DC | PRN
Start: 2015-02-18 — End: 2016-02-29

## 2015-02-18 NOTE — Addendum Note (Signed)
Addended by: Tonye Becket on: 02/18/2015 09:52 AM   Modules accepted: Kipp Brood

## 2015-02-18 NOTE — Progress Notes (Signed)
Subjective:    Patient ID: Nathaniel Guzman, male    DOB: [birth year], 49 y.o.   MRN: [id number]  HPI  patient is here today for annual physical. Patient feels well and has no complaints. Also reviewed chronic medical conditions, interval events and current concerns  Past Medical History  Diagnosis Date  . Tension headache, chronic    Family History  Problem Relation Age of Onset  . Lung cancer Cousin    Social History  Substance Use Topics  . Smoking status: Never Smoker   . Smokeless tobacco: None  . Alcohol Use: Yes    Review of Systems  Constitutional: Negative for fever, activity change, appetite change, fatigue and unexpected weight change.  Respiratory: Negative for cough, chest tightness, shortness of breath and wheezing.   Cardiovascular: Negative for chest pain, palpitations and leg swelling.  Gastrointestinal: Positive for abdominal pain (L costal edge of ribs x 1 mo after extended stretch during tennis serve action - feels bruised to touch ). Negative for nausea, diarrhea, constipation and blood in stool.  Skin: Positive for rash (R ant thigh since 07/2014 - darkened skin patch, occ itch - no ulcer or recollection of injury/contact - no spread).  Neurological: Negative for dizziness, weakness and headaches.  Psychiatric/Behavioral: Negative for dysphoric mood. The patient is not nervous/anxious.   All other systems reviewed and are negative.      Objective:    Physical Exam  Constitutional: He is oriented to person, place, and time. He appears well-developed and well-nourished. No distress.  HENT:  Head: Normocephalic and atraumatic.  Nose: Nose normal.  Mouth/Throat: Oropharynx is clear and moist.  Hearing grossly normal.  Eyes: Conjunctivae and EOM are normal. Pupils are equal, round, and reactive to light. No scleral icterus.  Neck: Normal range of motion. Neck supple. No JVD present. No thyromegaly present.  Cardiovascular: Normal rate, regular rhythm, normal  heart sounds and intact distal pulses.  Exam reveals no friction rub.   No murmur heard. No edema.  Pulmonary/Chest: Effort normal and breath sounds normal. No respiratory distress. He has no wheezes.  Abdominal: Soft. Bowel sounds are normal. He exhibits no distension and no mass. Tenderness: specific point tenderness over false left rib margin , reproducible with pressure. There is no guarding.  Genitourinary:  defer  Musculoskeletal: Normal range of motion. He exhibits no edema or tenderness.  Lymphadenopathy:    He has no cervical adenopathy.  Neurological: He is alert and oriented to person, place, and time. He has normal reflexes. No cranial nerve deficit.  Skin: Skin is warm and dry. No rash noted. No erythema.  1" darkening skin patch consistent with tinea corporis on right anterior thigh  Psychiatric: He has a normal mood and affect. His behavior is normal. Thought content normal.    BP 110/80 mmHg  Pulse 64  Temp(Src) 98.2 F (36.8 C) (Oral)  Ht 5\' 3"  (1.6 m)  Wt 137 lb 12 oz (62.483 kg)  BMI 24.41 kg/m2  SpO2 96% Wt Readings from Last 3 Encounters:  02/18/15 137 lb 12 oz (62.483 kg)  10/27/13 136 lb 12.8 oz (62.052 kg)  06/10/13 133 lb (60.328 kg)    Lab Results  Component Value Date   WBC 5.5 10/27/2013   HGB 15.5 10/27/2013   HCT 45.4 10/27/2013   PLT 189.0 10/27/2013   GLUCOSE 97 10/27/2013   CHOL 174 06/24/2014   TRIG 174* 06/24/2014   HDL 39 06/24/2014   LDLCALC 100 06/24/2014   ALT 13  06/24/2014   AST 18 06/24/2014   NA 140 10/27/2013   K 3.9 10/27/2013   CL 104 10/27/2013   CREATININE 7.0* 06/24/2014   BUN 1* 06/24/2014   CO2 29 10/27/2013   TSH 1.51 10/27/2013   PSA 1.89 10/27/2013    No results found.     Assessment & Plan:   CPX/z00.00 - Patient has been counseled on age-appropriate routine health concerns for screening and prevention. These are reviewed and up-to-date. Immunizations are up-to-date or declined. Labs ordered and  reviewed.  Tinea corporis. Lotrisone topical cream prescribed. Education and reassurance provided. for dermatology evaluation if worse or unimproved  Rib contusion versus chest wall muscle tear. One-month duration of tenderness over left lateral rib cage following extended stretch with tennis serve action. Will arrange evaluation by sports medicine for ultrasound of this area. Advised conservative care such as anti-inflammatory, Tylenol and ice as needed  Problem List Items Addressed This Visit    None    Visit Diagnoses    Routine general medical examination at a health care facility    -  Primary    Relevant Orders    Basic metabolic panel    CBC with Differential/Platelet    Hepatic function panel    Lipid panel    TSH    Urinalysis, Routine w reflex microscopic (not at [facility])    PSA    Tinea corporis        Relevant Medications    clotrimazole-betamethasone (LOTRISONE) cream    Rib pain on left side            Rene Paci, MD

## 2015-02-18 NOTE — Progress Notes (Signed)
Pre visit review using our clinic review tool, if applicable. No additional management support is needed unless otherwise documented below in the visit note. 

## 2015-02-18 NOTE — Patient Instructions (Addendum)
It was good to see you today.  We have reviewed your prior records including labs and tests today  Health Maintenance reviewed - flu shot due every fall. All other recommended immunizations and age-appropriate screenings are up-to-date.  Test(s) ordered today. Your results will be released to MyChart (or called to you) after review, usually within 72hours after test completion. If any changes need to be made, you will be notified at that same time.  Medications reviewed and updated Use prescription generic Lotrisone for skin rash. Please let us know if unimproved, sooner if worse, for referral to dermatology for additional evaluation and treatment as needed No other prescription changes recommended at this time.  Your prescription(s) have been submitted to your pharmacy. Please take as directed and contact our office if you believe you are having problem(s) with the medication(s).  We'll arrange evaluation by Dr. Katrinka Blazing, sports medicine specialist for injury related to tenderness over left rib cage  Please schedule followup in 12 months for annual exam and labs, call sooner if problems.  Health Maintenance A healthy lifestyle and preventative care can promote health and wellness.  Maintain regular health, dental, and eye exams.  Eat a healthy diet. Foods like vegetables, fruits, whole grains, low-fat dairy products, and lean protein foods contain the nutrients you need and are low in calories. Decrease your intake of foods high in solid fats, added sugars, and salt. Get information about a proper diet from your health care provider, if necessary.  Regular physical exercise is one of the most important things you can do for your health. Most adults should get at least 150 minutes of moderate-intensity exercise (any activity that increases your heart rate and causes you to sweat) each week. In addition, most adults need muscle-strengthening exercises on 2 or more days a week.   Maintain a  healthy weight. The body mass index (BMI) is a screening tool to identify possible weight problems. It provides an estimate of body fat based on height and weight. Your health care provider can find your BMI and can help you achieve or maintain a healthy weight. For males 20 years and older:  A BMI below 18.5 is considered underweight.  A BMI of 18.5 to 24.9 is normal.  A BMI of 25 to 29.9 is considered overweight.  A BMI of 30 and above is considered obese.  Maintain normal blood lipids and cholesterol by exercising and minimizing your intake of saturated fat. Eat a balanced diet with plenty of fruits and vegetables. Blood tests for lipids and cholesterol should begin at age 81 and be repeated every 5 years. If your lipid or cholesterol levels are high, you are over age 80, or you are at high risk for heart disease, you may need your cholesterol levels checked more frequently.Ongoing high lipid and cholesterol levels should be treated with medicines if diet and exercise are not working.  If you smoke, find out from your health care provider how to quit. If you do not use tobacco, do not start.  Lung cancer screening is recommended for adults aged 55-80 years who are at high risk for developing lung cancer because of a history of smoking. A yearly low-dose CT scan of the lungs is recommended for people who have at least a 30-pack-year history of smoking and are current smokers or have quit within the past 15 years. A pack year of smoking is smoking an average of 1 pack of cigarettes a day for 1 year (for example, a 30-pack-year  history of smoking could mean smoking 1 pack a day for 30 years or 2 packs a day for 15 years). Yearly screening should continue until the smoker has stopped smoking for at least 15 years. Yearly screening should be stopped for people who develop a health problem that would prevent them from having lung cancer treatment.  If you choose to drink alcohol, do not have more than  2 drinks per day. One drink is considered to be 12 oz (360 mL) of beer, 5 oz (150 mL) of wine, or 1.5 oz (45 mL) of liquor.  Avoid the use of street drugs. Do not share needles with anyone. Ask for help if you need support or instructions about stopping the use of drugs.  High blood pressure causes heart disease and increases the risk of stroke. Blood pressure should be checked at least every 1-2 years. Ongoing high blood pressure should be treated with medicines if weight loss and exercise are not effective.  If you are 76-72 years old, ask your health care provider if you should take aspirin to prevent heart disease.  Diabetes screening involves taking a blood sample to check your fasting blood sugar level. This should be done once every 3 years after age 7 if you are at a normal weight and without risk factors for diabetes. Testing should be considered at a younger age or be carried out more frequently if you are overweight and have at least 1 risk factor for diabetes.  Colorectal cancer can be detected and often prevented. Most routine colorectal cancer screening begins at the age of 61 and continues through age 63. However, your health care provider may recommend screening at an earlier age if you have risk factors for colon cancer. On a yearly basis, your health care provider may provide home test kits to check for hidden blood in the stool. A small camera at the end of a tube may be used to directly examine the colon (sigmoidoscopy or colonoscopy) to detect the earliest forms of colorectal cancer. Talk to your health care provider about this at age 40 when routine screening begins. A direct exam of the colon should be repeated every 5-10 years through age 52, unless early forms of precancerous polyps or small growths are found.  People who are at an increased risk for hepatitis B should be screened for this virus. You are considered at high risk for hepatitis B if:  You were born in a country  where hepatitis B occurs often. Talk with your health care provider about which countries are considered high risk.  Your parents were born in a high-risk country and you have not received a shot to protect against hepatitis B (hepatitis B vaccine).  You have HIV or AIDS.  You use needles to inject street drugs.  You live with, or have sex with, someone who has hepatitis B.  You are a man who has sex with other men (MSM).  You get hemodialysis treatment.  You take certain medicines for conditions like cancer, organ transplantation, and autoimmune conditions.  Hepatitis C blood testing is recommended for all people born from [birth year] through 1963 and any individual with known risk factors for hepatitis C.  Healthy men should no longer receive prostate-specific antigen (PSA) blood tests as part of routine cancer screening. Talk to your health care provider about prostate cancer screening.  Testicular cancer screening is not recommended for adolescents or adult males who have no symptoms. Screening includes self-exam, a health care provider  exam, and other screening tests. Consult with your health care provider about any symptoms you have or any concerns you have about testicular cancer.  Practice safe sex. Use condoms and avoid high-risk sexual practices to reduce the spread of sexually transmitted infections (STIs).  You should be screened for STIs, including gonorrhea and chlamydia if:  You are sexually active and are younger than 24 years.  You are older than 24 years, and your health care provider tells you that you are at risk for this type of infection.  Your sexual activity has changed since you were last screened, and you are at an increased risk for chlamydia or gonorrhea. Ask your health care provider if you are at risk.  If you are at risk of being infected with HIV, it is recommended that you take a prescription medicine daily to prevent HIV infection. This is called  pre-exposure prophylaxis (PrEP). You are considered at risk if:  You are a man who has sex with other men (MSM).  You are a heterosexual man who is sexually active with multiple partners.  You take drugs by injection.  You are sexually active with a partner who has HIV.  Talk with your health care provider about whether you are at high risk of being infected with HIV. If you choose to begin PrEP, you should first be tested for HIV. You should then be tested every 3 months for as long as you are taking PrEP.  Use sunscreen. Apply sunscreen liberally and repeatedly throughout the day. You should seek shade when your shadow is shorter than you. Protect yourself by wearing long sleeves, pants, a wide-brimmed hat, and sunglasses year round whenever you are outdoors.  Tell your health care provider of new moles or changes in moles, especially if there is a change in shape or color. Also, tell your health care provider if a mole is larger than the size of a pencil eraser.  A one-time screening for abdominal aortic aneurysm (AAA) and surgical repair of large AAAs by ultrasound is recommended for men aged 65-75 years who are current or former smokers.  Stay current with your vaccines (immunizations). Document Released: 12/09/2007 Document Revised: 06/17/2013 Document Reviewed: 11/07/2010 [facility] Patient Information 2015 [place], [place]. This information is not intended to replace advice given to you by your health care provider. Make sure you discuss any questions you have with your health care provider.

## 2015-03-09 ENCOUNTER — Ambulatory Visit: Payer: 59 | Admitting: Family Medicine

## 2016-02-21 ENCOUNTER — Ambulatory Visit: Payer: 59 | Admitting: Internal Medicine

## 2016-02-29 ENCOUNTER — Ambulatory Visit (INDEPENDENT_AMBULATORY_CARE_PROVIDER_SITE_OTHER): Payer: 59 | Admitting: Nurse Practitioner

## 2016-02-29 ENCOUNTER — Encounter: Payer: Self-pay | Admitting: Nurse Practitioner

## 2016-02-29 ENCOUNTER — Other Ambulatory Visit (INDEPENDENT_AMBULATORY_CARE_PROVIDER_SITE_OTHER): Payer: 59

## 2016-02-29 VITALS — BP 112/74 | HR 55 | Temp 98.5°F | Ht 64.0 in | Wt 135.0 lb

## 2016-02-29 DIAGNOSIS — B354 Tinea corporis: Secondary | ICD-10-CM | POA: Insufficient documentation

## 2016-02-29 DIAGNOSIS — R6889 Other general symptoms and signs: Secondary | ICD-10-CM

## 2016-02-29 DIAGNOSIS — Z0001 Encounter for general adult medical examination with abnormal findings: Secondary | ICD-10-CM

## 2016-02-29 DIAGNOSIS — Z0000 Encounter for general adult medical examination without abnormal findings: Secondary | ICD-10-CM

## 2016-02-29 LAB — LIPID PANEL
Cholesterol: 172 mg/dL (ref 0–200)
HDL: 41.2 mg/dL (ref >39.00)
LDL Cholesterol: 101 mg/dL — ABNORMAL HIGH (ref 0–99)
NONHDL: 130.76
Total CHOL/HDL Ratio: 4
Triglycerides: 149 mg/dL (ref 0.0–149.0)
VLDL: 29.8 mg/dL (ref 0.0–40.0)

## 2016-02-29 LAB — CBC WITH DIFFERENTIAL/PLATELET
BASOS ABS: 0 10*3/uL (ref 0.0–0.1)
Basophils Relative: 0.5 % (ref 0.0–3.0)
Eosinophils Absolute: 0.2 10*3/uL (ref 0.0–0.7)
Eosinophils Relative: 2.5 % (ref 0.0–5.0)
HCT: 44.5 % (ref 39.0–52.0)
Hemoglobin: 15.5 g/dL (ref 13.0–17.0)
LYMPHS ABS: 1.9 10*3/uL (ref 0.7–4.0)
Lymphocytes Relative: 29.3 % (ref 12.0–46.0)
MCHC: 34.8 g/dL (ref 30.0–36.0)
MCV: 85 fl (ref 78.0–100.0)
MONO ABS: 0.5 10*3/uL (ref 0.1–1.0)
MONOS PCT: 7.6 % (ref 3.0–12.0)
NEUTROS ABS: 3.9 10*3/uL (ref 1.4–7.7)
NEUTROS PCT: 60.1 % (ref 43.0–77.0)
PLATELETS: 174 10*3/uL (ref 150.0–400.0)
RBC: 5.24 Mil/uL (ref 4.22–5.81)
RDW: 13.3 % (ref 11.5–15.5)
WBC: 6.4 10*3/uL (ref 4.0–10.5)

## 2016-02-29 LAB — COMPREHENSIVE METABOLIC PANEL
ALK PHOS: 74 U/L (ref 39–117)
ALT: 11 U/L (ref 0–53)
AST: 16 U/L (ref 0–37)
Albumin: 4.3 g/dL (ref 3.5–5.2)
BILIRUBIN TOTAL: 0.9 mg/dL (ref 0.2–1.2)
BUN: 11 mg/dL (ref 6–23)
CO2: 31 mEq/L (ref 19–32)
Calcium: 9 mg/dL (ref 8.4–10.5)
Chloride: 103 mEq/L (ref 96–112)
Creatinine, Ser: 1.01 mg/dL (ref 0.40–1.50)
GFR: 83.61 mL/min (ref >60.00)
GLUCOSE: 97 mg/dL (ref 70–99)
Potassium: 3.7 mEq/L (ref 3.5–5.1)
SODIUM: 139 meq/L (ref 135–145)
TOTAL PROTEIN: 7.3 g/dL (ref 6.0–8.3)

## 2016-02-29 MED ORDER — KETOCONAZOLE 2 % EX CREA: 1.0000 "application " | TOPICAL_CREAM | Freq: Every day | CUTANEOUS | 0 refills | Status: DC

## 2016-02-29 MED ORDER — FLUCONAZOLE 200 MG PO TABS: 200.0000 mg | ORAL_TABLET | Freq: Every day | ORAL | 0 refills | Status: DC

## 2016-02-29 NOTE — Progress Notes (Signed)
Subjective:    Patient ID: Nathaniel Guzman, male    DOB: 1966/04/12, 50 y.o.   MRN: [id number]  Patient presents today for complete physical and rash  Rash  This is a chronic problem. The current episode started more than 1 year ago. The problem has been waxing and waning since onset. The affected locations include the right upper leg. The rash is characterized by itchiness and scaling. He was exposed to nothing. Pertinent negatives include no anorexia, congestion, cough, diarrhea, eye pain, fatigue, fever, joint pain, nail changes, rhinorrhea, shortness of breath, sore throat or vomiting. Past treatments include topical steroids (clotrimazole and betametasone cream x 2weeks). The treatment provided no relief. There is no history of allergies, asthma, eczema or varicella.    Immunizations: will get influenza vaccine from job. Health Maintenance  Topic Date Due  . Flu Shot  01/25/2016  . Tetanus Vaccine  12/25/2018  . HIV Screening  Completed   Diet:healthy Weight: no concern Wt Readings from Last 3 Encounters:  02/29/16 135 lb (61.2 kg)  02/18/15 137 lb 12 oz (62.5 kg)  10/27/13 136 lb 12.8 oz (62.1 kg)   Exercise:walking and soccer once a week  Medications and allergies reviewed with patient and updated if appropriate.  Patient Active Problem List   Diagnosis Date Noted  . Tinea corporis 02/29/2016    Current Outpatient Prescriptions on File Prior to Visit  Medication Sig Dispense Refill  . Ascorbic Acid (VITAMIN C) 100 MG tablet Take 100 mg by mouth as needed (Patient uses OTC Vitamin C when he feels like he is becoming ill.).     No current facility-administered medications on file prior to visit.     Past Medical History:  Diagnosis Date  . Tension headache, chronic     Past Surgical History:  Procedure Laterality Date  . NO PAST SURGERIES      Social History   Social History  . Marital status: Single    Spouse name: N/A  . Number of children: N/A  . Years of  education: N/A   Social History Main Topics  . Smoking status: Never Smoker  . Smokeless tobacco: Never Used  . Alcohol use Yes  . Drug use: No  . Sexual activity: Not Asked   Other Topics Concern  . None   Social History Narrative   Lives with wife and son -     Family History  Problem Relation Age of Onset  . Lung cancer Cousin         Review of Systems  Constitutional: Negative for fatigue and fever.  HENT: Negative for congestion, rhinorrhea and sore throat.   Eyes: Negative for pain.  Respiratory: Negative.  Negative for cough and shortness of breath.   Cardiovascular: Negative.   Gastrointestinal: Negative for abdominal pain, anorexia, blood in stool, constipation, diarrhea, heartburn, melena, nausea and vomiting.  Musculoskeletal: Negative for back pain, joint pain and myalgias.  Skin: Positive for rash. Negative for nail changes.  Neurological: Negative.   Endo/Heme/Allergies: Negative.     Objective:   Vitals:   02/29/16 0859  BP: 112/74  Pulse: (!) 55  Temp: 98.5 F (36.9 C)    Body mass index is 23.17 kg/m.   Physical Examination:  Physical Exam  Constitutional: He is oriented to person, place, and time and well-developed, well-nourished, and in no distress. No distress.  HENT:  Right Ear: External ear normal.  Left Ear: External ear normal.  Nose: Nose normal.  Eyes: Conjunctivae and EOM  are normal. Pupils are equal, round, and reactive to light.  Neck: Normal range of motion. Neck supple. No thyromegaly present.  Cardiovascular: Normal rate and normal heart sounds.   Pulmonary/Chest: Effort normal and breath sounds normal.  Abdominal: Soft. Bowel sounds are normal. He exhibits no distension. There is no tenderness.  Musculoskeletal: Normal range of motion. He exhibits no edema.  Lymphadenopathy:    He has no cervical adenopathy.  Neurological: He is alert and oriented to person, place, and time. No cranial nerve deficit. Gait normal.    Skin: Rash noted.  Psychiatric: Affect and judgment normal.  Vitals reviewed.  Recent Results (from the past 2160 hour(s))  CBC w/Diff     Status: None   Collection Time: 02/29/16 10:02 AM  Result Value Ref Range   WBC 6.4 4.0 - 10.5 K/uL   RBC 5.24 4.22 - 5.81 Mil/uL   Hemoglobin 15.5 13.0 - 17.0 g/dL   HCT 16.1 09.6 - 04.5 %   MCV 85.0 78.0 - 100.0 fl   MCHC 34.8 30.0 - 36.0 g/dL   RDW 40.9 81.1 - 91.4 %   Platelets 174.0 150.0 - 400.0 K/uL   Neutrophils Relative % 60.1 43.0 - 77.0 %   Lymphocytes Relative 29.3 12.0 - 46.0 %   Monocytes Relative 7.6 3.0 - 12.0 %   Eosinophils Relative 2.5 0.0 - 5.0 %   Basophils Relative 0.5 0.0 - 3.0 %   Neutro Abs 3.9 1.4 - 7.7 K/uL   Lymphs Abs 1.9 0.7 - 4.0 K/uL   Monocytes Absolute 0.5 0.1 - 1.0 K/uL   Eosinophils Absolute 0.2 0.0 - 0.7 K/uL   Basophils Absolute 0.0 0.0 - 0.1 K/uL  Lipid Profile     Status: Abnormal   Collection Time: 02/29/16 10:02 AM  Result Value Ref Range   Cholesterol 172 0 - 200 mg/dL    Comment: ATP III Classification       Desirable:  < 200 mg/dL               Borderline High:  200 - 239 mg/dL          High:  > = 782 mg/dL   Triglycerides 956.2 0.0 - 149.0 mg/dL    Comment: Normal:  <130 mg/dLBorderline High:  150 - 199 mg/dL   HDL 86.57 >84.69 mg/dL   VLDL 62.9 0.0 - 52.8 mg/dL   LDL Cholesterol 413 (H) 0 - 99 mg/dL   Total CHOL/HDL Ratio 4     Comment:                Men          Women1/2 Average Risk     3.4          3.3Average Risk          5.0          4.42X Average Risk          9.6          7.13X Average Risk          15.0          11.0                       NonHDL 130.76     Comment: NOTE:  Non-HDL goal should be 30 mg/dL higher than patient's LDL goal (i.e. LDL goal of < 70 mg/dL, would have non-HDL goal of < 100 mg/dL)  Comprehensive metabolic panel  Status: None   Collection Time: 02/29/16 10:02 AM  Result Value Ref Range   Sodium 139 135 - 145 mEq/L   Potassium 3.7 3.5 - 5.1 mEq/L    Chloride 103 96 - 112 mEq/L   CO2 31 19 - 32 mEq/L   Glucose, Bld 97 70 - 99 mg/dL   BUN 11 6 - 23 mg/dL   Creatinine, Ser 1.61 0.40 - 1.50 mg/dL   Total Bilirubin 0.9 0.2 - 1.2 mg/dL   Alkaline Phosphatase 74 39 - 117 U/L   AST 16 0 - 37 U/L   ALT 11 0 - 53 U/L   Total Protein 7.3 6.0 - 8.3 g/dL   Albumin 4.3 3.5 - 5.2 g/dL   Calcium 9.0 8.4 - 09.6 mg/dL   GFR 04.54 >09.81 mL/min   ASSESSMENT and PLAN:  Nathaniel Guzman was seen today for rash and annual exam.  Diagnoses and all orders for this visit:  Encounter for general adult medical examination with abnormal findings -     CBC w/Diff; Future -     Comprehensive metabolic panel -     Lipid Profile; Future  Tinea corporis -     ketoconazole (NIZORAL) 2 % cream; Apply 1 application topically daily. -     fluconazole (DIFLUCAN) 200 MG tablet; Take 1 tablet (200 mg total) by mouth daily.   Tinea corporis Ketoconazole cream and diflucan x1tab prescribed Refer to dermatology if no improvement in 87month.     Follow up: Return if symptoms worsen or fail to improve.  Alysia Penna, NP

## 2016-02-29 NOTE — Patient Instructions (Addendum)
Call office for referral to dermatology if no improvement in 3weeks.  Body Ringworm Ringworm (tinea corporis) is a fungal infection of the skin on the body. This infection is not caused by worms, but is actually caused by a fungus. Fungus normally lives on the top of your skin and can be useful. However, in the case of ringworms, the fungus grows out of control and causes a skin infection. It can involve any area of skin on the body and can spread easily from one person to another (contagious). Ringworm is a common problem for children, but it can affect adults as well. Ringworm is also often found in athletes, especially wrestlers who share equipment and mats.  CAUSES  Ringworm of the body is caused by a fungus called dermatophyte. It can spread by:  Touchingother people who are infected.  Touchinginfected pets.  Touching or sharingobjects that have been in contact with the infected person or pet (hats, combs, towels, clothing, sports equipment). SYMPTOMS   Itchy, raised red spots and bumps on the skin.  Ring-shaped rash.  Redness near the border of the rash with a clear center.  Dry and scaly skin on or around the rash. Not every person develops a ring-shaped rash. Some develop only the red, scaly patches. DIAGNOSIS  Most often, ringworm can be diagnosed by performing a skin exam. Your caregiver may choose to take a skin scraping from the affected area. The sample will be examined under the microscope to see if the fungus is present.  TREATMENT  Body ringworm may be treated with a topical antifungal cream or ointment. Sometimes, an antifungal shampoo that can be used on your body is prescribed. You may be prescribed antifungal medicines to take by mouth if your ringworm is severe, keeps coming back, or lasts a long time.  HOME CARE INSTRUCTIONS   Only take over-the-counter or prescription medicines as directed by your caregiver.  Wash the infected area and dry it completely before  applying yourcream or ointment.  When using antifungal shampoo to treat the ringworm, leave the shampoo on the body for 3-5 minutes before rinsing.   Wear loose clothing to stop clothes from rubbing and irritating the rash.  Wash or change your bed sheets every night while you have the rash.  Have your pet treated by your veterinarian if it has the same infection. To prevent ringworm:   Practice good hygiene.  Wear sandals or shoes in public places and showers.  Do not share personal items with others.  Avoid touching red patches of skin on other people.  Avoid touching pets that have bald spots or wash your hands after doing so. SEEK MEDICAL CARE IF:   Your rash continues to spread after 7 days of treatment.  Your rash is not gone in 4 weeks.  The area around your rash becomes red, warm, tender, and swollen.   This information is not intended to replace advice given to you by your health care provider. Make sure you discuss any questions you have with your health care provider.   Document Released: 06/09/2000 Document Revised: 03/06/2012 Document Reviewed: 12/25/2011 Else[employer] Interactive Patient Education [employer]2016 [employer]

## 2016-02-29 NOTE — Progress Notes (Signed)
Pre visit review using our clinic review tool, if applicable. No additional management support is needed unless otherwise documented below in the visit note. 

## 2016-02-29 NOTE — Assessment & Plan Note (Signed)
Ketoconazole cream and diflucan x1tab prescribed Refer to dermatology if no improvement in 89month.

## 2016-07-29 ENCOUNTER — Encounter: Payer: Self-pay | Admitting: Student

## 2017-03-05 ENCOUNTER — Encounter: Payer: 59 | Admitting: Nurse Practitioner

## 2017-10-29 ENCOUNTER — Encounter: Payer: 59 | Admitting: Family

## 2017-10-29 ENCOUNTER — Encounter: Payer: 59 | Admitting: Nurse Practitioner

## 2017-11-14 ENCOUNTER — Ambulatory Visit (INDEPENDENT_AMBULATORY_CARE_PROVIDER_SITE_OTHER): Payer: 59 | Admitting: Nurse Practitioner

## 2017-11-14 ENCOUNTER — Encounter: Payer: Self-pay | Admitting: Nurse Practitioner

## 2017-11-14 ENCOUNTER — Other Ambulatory Visit (INDEPENDENT_AMBULATORY_CARE_PROVIDER_SITE_OTHER): Payer: 59

## 2017-11-14 VITALS — BP 120/68 | HR 57 | Temp 98.4°F | Resp 16 | Ht 64.0 in | Wt 139.0 lb

## 2017-11-14 DIAGNOSIS — R4582 Worries: Secondary | ICD-10-CM

## 2017-11-14 DIAGNOSIS — Z13220 Encounter for screening for lipoid disorders: Secondary | ICD-10-CM | POA: Diagnosis not present

## 2017-11-14 DIAGNOSIS — Z125 Encounter for screening for malignant neoplasm of prostate: Secondary | ICD-10-CM

## 2017-11-14 DIAGNOSIS — Z0000 Encounter for general adult medical examination without abnormal findings: Secondary | ICD-10-CM | POA: Insufficient documentation

## 2017-11-14 LAB — LIPID PANEL
CHOL/HDL RATIO: 4
CHOLESTEROL: 175 mg/dL (ref 0–200)
HDL: 42.7 mg/dL (ref >39.00)
LDL CALC: 105 mg/dL — AB (ref 0–99)
NonHDL: 131.88
TRIGLYCERIDES: 133 mg/dL (ref 0.0–149.0)
VLDL: 26.6 mg/dL (ref 0.0–40.0)

## 2017-11-14 LAB — TSH: TSH: 1.53 u[IU]/mL (ref 0.35–4.50)

## 2017-11-14 LAB — COMPREHENSIVE METABOLIC PANEL
ALBUMIN: 4.5 g/dL (ref 3.5–5.2)
ALT: 13 U/L (ref 0–53)
AST: 14 U/L (ref 0–37)
Alkaline Phosphatase: 69 U/L (ref 39–117)
BILIRUBIN TOTAL: 0.8 mg/dL (ref 0.2–1.2)
BUN: 12 mg/dL (ref 6–23)
CALCIUM: 9.3 mg/dL (ref 8.4–10.5)
CO2: 30 meq/L (ref 19–32)
CREATININE: 0.94 mg/dL (ref 0.40–1.50)
Chloride: 105 mEq/L (ref 96–112)
GFR: 90.2 mL/min (ref >60.00)
Glucose, Bld: 102 mg/dL — ABNORMAL HIGH (ref 70–99)
Potassium: 4.1 mEq/L (ref 3.5–5.1)
Sodium: 141 mEq/L (ref 135–145)
Total Protein: 7.1 g/dL (ref 6.0–8.3)

## 2017-11-14 LAB — CBC
HEMATOCRIT: 46.3 % (ref 39.0–52.0)
Hemoglobin: 15.5 g/dL (ref 13.0–17.0)
MCHC: 33.6 g/dL (ref 30.0–36.0)
MCV: 88.6 fl (ref 78.0–100.0)
Platelets: 170 10*3/uL (ref 150.0–400.0)
RBC: 5.22 Mil/uL (ref 4.22–5.81)
RDW: 13.1 % (ref 11.5–15.5)
WBC: 5.3 10*3/uL (ref 4.0–10.5)

## 2017-11-14 LAB — PSA: PSA: 1.8 ng/mL (ref 0.10–4.00)

## 2017-11-14 NOTE — Patient Instructions (Addendum)
Please head downstairs for lab work If any of your test results are critically abnormal, you will be contacted right away. Your results may be released to your MyChart for viewing before I am able to provide you with my response. I will contact you within a week about your test results and any recommendations for abnormalities.  I will plan to see you in 1 year for your annual physical, or sooner if you need me. It was nice to meet you  Health Maintenance, Male A healthy lifestyle and preventive care is important for your health and wellness. Ask your health care provider about what schedule of regular examinations is right for you. What should I know about weight and diet? Eat a Healthy Diet  Eat plenty of vegetables, fruits, whole grains, low-fat dairy products, and lean protein.  Do not eat a lot of foods high in solid fats, added sugars, or salt.  Maintain a Healthy Weight Regular exercise can help you achieve or maintain a healthy weight. You should:  Do at least 150 minutes of exercise each week. The exercise should increase your heart rate and make you sweat (moderate-intensity exercise).  Do strength-training exercises at least twice a week.  Watch Your Levels of Cholesterol and Blood Lipids  Have your blood tested for lipids and cholesterol every 5 years starting at 50 years of age. If you are at high risk for heart disease, you should start having your blood tested when you are 50 years old. You may need to have your cholesterol levels checked more often if: ? Your lipid or cholesterol levels are high. ? You are older than 50 years of age. ? You are at high risk for heart disease.  What should I know about cancer screening? Many types of cancers can be detected early and may often be prevented. Lung Cancer  You should be screened every year for lung cancer if: ? You are a current smoker who has smoked for at least 30 years. ? You are a former smoker who has quit within  the past 15 years.  Talk to your health care provider about your screening options, when you should start screening, and how often you should be screened.  Colorectal Cancer  Routine colorectal cancer screening usually begins at 50 years of age and should be repeated every 5-10 years until you are 50 years old. You may need to be screened more often if early forms of precancerous polyps or small growths are found. Your health care provider may recommend screening at an earlier age if you have risk factors for colon cancer.  Your health care provider may recommend using home test kits to check for hidden blood in the stool.  A small camera at the end of a tube can be used to examine your colon (sigmoidoscopy or colonoscopy). This checks for the earliest forms of colorectal cancer.  Prostate and Testicular Cancer  Depending on your age and overall health, your health care provider may do certain tests to screen for prostate and testicular cancer.  Talk to your health care provider about any symptoms or concerns you have about testicular or prostate cancer.  Skin Cancer  Check your skin from head to toe regularly.  Tell your health care provider about any new moles or changes in moles, especially if: ? There is a change in a mole's size, shape, or color. ? You have a mole that is larger than a pencil eraser.  Always use sunscreen. Apply sunscreen liberally  and repeat throughout the day.  Protect yourself by wearing long sleeves, pants, a wide-brimmed hat, and sunglasses when outside.  What should I know about heart disease, diabetes, and high blood pressure?  If you are 43-64 years of age, have your blood pressure checked every 3-5 years. If you are 49 years of age or older, have your blood pressure checked every year. You should have your blood pressure measured twice-once when you are at a hospital or clinic, and once when you are not at a hospital or clinic. Record the average of the  two measurements. To check your blood pressure when you are not at a hospital or clinic, you can use: ? An automated blood pressure machine at a pharmacy. ? A home blood pressure monitor.  Talk to your health care provider about your target blood pressure.  If you are between 39-22 years old, ask your health care provider if you should take aspirin to prevent heart disease.  Have regular diabetes screenings by checking your fasting blood sugar level. ? If you are at a normal weight and have a low risk for diabetes, have this test once every three years after the age of 51. ? If you are overweight and have a high risk for diabetes, consider being tested at a younger age or more often.  A one-time screening for abdominal aortic aneurysm (AAA) by ultrasound is recommended for men aged 65-75 years who are current or former smokers. What should I know about preventing infection? Hepatitis B If you have a higher risk for hepatitis B, you should be screened for this virus. Talk with your health care provider to find out if you are at risk for hepatitis B infection. Hepatitis C Blood testing is recommended for:  Everyone born from [birth year] through 1963.  Anyone with known risk factors for hepatitis C.  Sexually Transmitted Diseases (STDs)  You should be screened each year for STDs including gonorrhea and chlamydia if: ? You are sexually active and are younger than 50 years of age. ? You are older than 50 years of age and your health care provider tells you that you are at risk for this type of infection. ? Your sexual activity has changed since you were last screened and you are at an increased risk for chlamydia or gonorrhea. Ask your health care provider if you are at risk.  Talk with your health care provider about whether you are at high risk of being infected with HIV. Your health care provider may recommend a prescription medicine to help prevent HIV infection.  What else can I  do?  Schedule regular health, dental, and eye exams.  Stay current with your vaccines (immunizations).  Do not use any tobacco products, such as cigarettes, chewing tobacco, and e-cigarettes. If you need help quitting, ask your health care provider.  Limit alcohol intake to no more than 2 drinks per day. One drink equals 12 ounces of beer, 5 ounces of wine, or 1 ounces of hard liquor.  Do not use street drugs.  Do not share needles.  Ask your health care provider for help if you need support or information about quitting drugs.  Tell your health care provider if you often feel depressed.  Tell your health care provider if you have ever been abused or do not feel safe at home. This information is not intended to replace advice given to you by your health care provider. Make sure you discuss any questions you have with your health  care provider. Document Released: 12/09/2007 Document Revised: 02/09/2016 Document Reviewed: 03/16/2015 Elsevier Interactive Patient Education  2018 [employer]  

## 2017-11-14 NOTE — Assessment & Plan Note (Signed)
-  Prostate cancer screening and PSA options (with potential risks and benefits of testing vs not testing) were discussed along with recent recs/guidelines. -USPSTF grade A and B recommendations reviewed with patient; age-appropriate recommendations, preventive care, screening tests, etc discussed and encouraged; healthy living encouraged; see AVS for patient education given to patient. Declines advanced directives packet -Discussed importance of 150 minutes of physical activity weekly, eat 6 servings of fruit/vegetables daily and drink plenty of water and avoid sweet beverages.  -Follow up and care instructions discussed and provided in AVS.   -Reviewed Health Maintenance: up to date  - Lipid panel; Future-Screening for cholesterol level Fasting today - CBC; Future - Comprehensive metabolic panel; Future - TSH; Future-Worries - PSA; Future-Screening for prostate cancer

## 2017-11-14 NOTE — Progress Notes (Signed)
Name: Nathaniel Guzman   MRN: [id number]    DOB: 1966/03/24   Date:11/14/2017       Progress Note  Subjective  Chief Complaint  Chief Complaint  Patient presents with  . Establish Care    fasting    HPI Nathaniel Guzman is transferring from another provider in our clinic. He is a healthy 52 yo male with no active health problems and not maintained on any daily medications. Patient presents for annual CPE  USPSTF grade A and B recommendations:  Diet: three meals a day, eating rice, vegetables and meat often; cooks at home, does not eat sweets Exercise: he does not routinely exercise but plays soccer every weekend  Depression:  No concerns for anxiety or depression  Hypertension: BP Readings from Last 3 Encounters:  11/14/17 120/68  02/29/16 112/74  02/18/15 110/80   Obesity: Wt Readings from Last 3 Encounters:  11/14/17 139 lb (63 kg)  02/29/16 135 lb (61.2 kg)  02/18/15 137 lb 12 oz (62.5 kg)   BMI Readings from Last 3 Encounters:  11/14/17 23.86 kg/m  02/29/16 23.17 kg/m  02/18/15 24.40 kg/m     Lipids:  Lab Results  Component Value Date   CHOL 172 02/29/2016   CHOL 172 02/18/2015   CHOL 174 06/24/2014   Lab Results  Component Value Date   HDL 41.20 02/29/2016   HDL 35.30 (L) 02/18/2015   HDL 39 06/24/2014   Lab Results  Component Value Date   LDLCALC 101 (H) 02/29/2016   LDLCALC 102 (H) 02/18/2015   LDLCALC 100 06/24/2014   Lab Results  Component Value Date   TRIG 149.0 02/29/2016   TRIG 171.0 (H) 02/18/2015   TRIG 174 (A) 06/24/2014   Lab Results  Component Value Date   CHOLHDL 4 02/29/2016   CHOLHDL 5 02/18/2015   CHOLHDL 4 10/27/2013   No results found for: LDLDIRECT Glucose:  Glucose, Bld  Date Value Ref Range Status  02/29/2016 97 70 - 99 mg/dL Final  40/98/1191 478 (H) 70 - 99 mg/dL Final  29/56/2130 97 70 - 99 mg/dL Final    Alcohol: 1 beer a week Tobacco use: no, never  STD testing and prevention (chl/gon/syphilis): declines screening,  no concerns HIV: screening done  Skin cancer: no concerning lesions or moles  Colorectal cancer: colonoscopy up to date  Prostate cancer: PSA screening ordered today Lab Results  Component Value Date   PSA 1.51 02/18/2015   PSA 1.89 10/27/2013   PSA 1.45 12/27/2011   IPSS Questionnaire (AUA-7): Over the past month.   1)  How often have you had a sensation of not emptying your bladder completely after you finish urinating?  0 - Not at all  2)  How often have you had to urinate again less than two hours after you finished urinating? 1  3)  How often have you found you stopped and started again several times when you urinated?  0 - Not at all  4) How difficult have you found it to postpone urination?  0 - Not at all  5) How often have you had a weak urinary stream?  0 - Not at all  6) How often have you had to push or strain to begin urination?  0 - Not at all  7) How many times did you most typically get up to urinate from the time you went to bed until the time you got up in the morning?  1 - 1 time  Total score:  2   Aspirin: not indicated ECG:  Not indicated  Vaccinations: up to date  Advanced Care Planning: A voluntary discussion about advance care planning including the explanation and discussion of advance directives.  Discussed health care proxy and Living will, and the patient DOES NOT have a living will at present time. If patient does have living will, I have requested they bring this to the clinic to be scanned in to their chart.  Patient Active Problem List   Diagnosis Date Noted  . Tinea corporis 02/29/2016    Past Surgical History:  Procedure Laterality Date  . NO PAST SURGERIES      Family History  Problem Relation Age of Onset  . Lung cancer Cousin     Social History   Socioeconomic History  . Marital status: Single    Spouse name: Not on file  . Number of children: Not on file  . Years of education: Not on file  . Highest education level: Not on  file  Occupational History  . Not on file  Social Needs  . Financial resource strain: Not on file  . Food insecurity:    Worry: Not on file    Inability: Not on file  . Transportation needs:    Medical: Not on file    Non-medical: Not on file  Tobacco Use  . Smoking status: Never Smoker  . Smokeless tobacco: Never Used  Substance and Sexual Activity  . Alcohol use: Yes  . Drug use: No  . Sexual activity: Not on file  Lifestyle  . Physical activity:    Days per week: Not on file    Minutes per session: Not on file  . Stress: Not on file  Relationships  . Social connections:    Talks on phone: Not on file    Gets together: Not on file    Attends religious service: Not on file    Active member of club or organization: Not on file    Attends meetings of clubs or organizations: Not on file    Relationship status: Not on file  . Intimate partner violence:    Fear of current or ex partner: Not on file    Emotionally abused: Not on file    Physically abused: Not on file    Forced sexual activity: Not on file  Other Topics Concern  . Not on file  Social History Narrative   Lives with wife and son -     No current outpatient medications on file.  Allergies  Allergen Reactions  . Hydrocodone Nausea And Vomiting  . Sulfa Antibiotics      ROS  Constitutional: Negative for fever or weight change.  Respiratory: Negative for cough and shortness of breath.   Cardiovascular: Negative for chest pain or palpitations.  Gastrointestinal: Negative for abdominal pain, no bowel changes.  Musculoskeletal: Negative for gait problem or joint swelling.  Skin: Negative for rash.  Neurological: Negative for dizziness or headache.  No other specific complaints in a complete review of systems (except as listed in HPI above).   Objective  Vitals:   11/14/17 0819  BP: 120/68  Pulse: (!) 57  Resp: 16  Temp: 98.4 F (36.9 C)  TempSrc: Oral  SpO2: 98%  Weight: 139 lb (63 kg)   Height:  (1.626 m)  heart rate stable  Body mass index is 23.86 kg/m.  Physical Exam Vital signs reviewed, Constitutional: Patient appears well-developed and well-nourished. No distress.  HENT: Head: Normocephalic and atraumatic.  Ears: B TMs ok, no erythema or effusion; Nose: Nose normal. Mouth/Throat: Oropharynx is clear and moist. No oropharyngeal exudate.  Eyes: Conjunctivae and EOM are normal. Pupils are equal, round, and reactive to light. No scleral icterus.  Neck: Normal range of motion. Neck supple. No cervical adenopathy. No thyromegaly present.  Cardiovascular: Normal rate, regular rhythm and normal heart sounds.  No murmur heard. No BLE edema. Distal pulses intact Pulmonary/Chest: Effort normal and breath sounds normal. No respiratory distress. Abdominal: Soft. Bowel sounds are normal, no distension. There is no tenderness. no masses Musculoskeletal: Normal range of motion. No gross deformities Neurological: he is alert and oriented to person, place, and time. No cranial nerve deficit. Coordination, balance, strength, speech and gait are normal.  Skin: Skin is warm and dry. No rash noted. No erythema.  Psychiatric: Patient has a normal mood and affect. behavior is normal. Judgment and thought content normal.   Assessment & Plan RTC in 1 year for CPE

## 2018-11-20 ENCOUNTER — Encounter: Payer: 59 | Admitting: Nurse Practitioner

## 2021-12-19 ENCOUNTER — Encounter: Payer: Self-pay | Admitting: Emergency Medicine

## 2021-12-19 ENCOUNTER — Ambulatory Visit (INDEPENDENT_AMBULATORY_CARE_PROVIDER_SITE_OTHER): Payer: 59 | Admitting: Emergency Medicine

## 2021-12-19 VITALS — BP 122/80 | HR 54 | Temp 98.2°F | Ht 64.0 in | Wt 137.2 lb

## 2021-12-19 DIAGNOSIS — Z13228 Encounter for screening for other metabolic disorders: Secondary | ICD-10-CM | POA: Diagnosis not present

## 2021-12-19 DIAGNOSIS — Z130 Encounter for screening for diseases of the blood and blood-forming organs and certain disorders involving the immune mechanism: Secondary | ICD-10-CM | POA: Diagnosis not present

## 2021-12-19 DIAGNOSIS — Z13220 Encounter for screening for lipoid disorders: Secondary | ICD-10-CM | POA: Diagnosis not present

## 2021-12-19 DIAGNOSIS — Z0000 Encounter for general adult medical examination without abnormal findings: Secondary | ICD-10-CM

## 2021-12-19 DIAGNOSIS — Z125 Encounter for screening for malignant neoplasm of prostate: Secondary | ICD-10-CM | POA: Diagnosis not present

## 2021-12-19 DIAGNOSIS — Z1329 Encounter for screening for other suspected endocrine disorder: Secondary | ICD-10-CM | POA: Diagnosis not present

## 2021-12-19 DIAGNOSIS — Z1159 Encounter for screening for other viral diseases: Secondary | ICD-10-CM | POA: Diagnosis not present

## 2021-12-19 LAB — COMPREHENSIVE METABOLIC PANEL
ALT: 15 U/L (ref 0–53)
AST: 18 U/L (ref 0–37)
Albumin: 4.4 g/dL (ref 3.5–5.2)
Alkaline Phosphatase: 55 U/L (ref 39–117)
BUN: 12 mg/dL (ref 6–23)
CO2: 31 mEq/L (ref 19–32)
Calcium: 9.2 mg/dL (ref 8.4–10.5)
Chloride: 103 mEq/L (ref 96–112)
Creatinine, Ser: 1.11 mg/dL (ref 0.40–1.50)
GFR: 75.37 mL/min (ref >60.00)
Glucose, Bld: 97 mg/dL (ref 70–99)
Potassium: 4 mEq/L (ref 3.5–5.1)
Sodium: 140 mEq/L (ref 135–145)
Total Bilirubin: 1.1 mg/dL (ref 0.2–1.2)
Total Protein: 6.6 g/dL (ref 6.0–8.3)

## 2021-12-19 LAB — CBC WITH DIFFERENTIAL/PLATELET
Basophils Absolute: 0 10*3/uL (ref 0.0–0.1)
Basophils Relative: 0.9 % (ref 0.0–3.0)
Eosinophils Absolute: 0.2 10*3/uL (ref 0.0–0.7)
Eosinophils Relative: 4 % (ref 0.0–5.0)
HCT: 44.1 % (ref 39.0–52.0)
Hemoglobin: 15 g/dL (ref 13.0–17.0)
Lymphocytes Relative: 32.2 % (ref 12.0–46.0)
Lymphs Abs: 1.6 10*3/uL (ref 0.7–4.0)
MCHC: 34.1 g/dL (ref 30.0–36.0)
MCV: 88.5 fl (ref 78.0–100.0)
Monocytes Absolute: 0.4 10*3/uL (ref 0.1–1.0)
Monocytes Relative: 8.8 % (ref 3.0–12.0)
Neutro Abs: 2.7 10*3/uL (ref 1.4–7.7)
Neutrophils Relative %: 54.1 % (ref 43.0–77.0)
Platelets: 169 10*3/uL (ref 150.0–400.0)
RBC: 4.98 Mil/uL (ref 4.22–5.81)
RDW: 13.3 % (ref 11.5–15.5)
WBC: 4.9 10*3/uL (ref 4.0–10.5)

## 2021-12-19 LAB — URINALYSIS
Bilirubin Urine: NEGATIVE
Hgb urine dipstick: NEGATIVE
Ketones, ur: NEGATIVE
Leukocytes,Ua: NEGATIVE
Nitrite: NEGATIVE
Specific Gravity, Urine: 1.025 (ref 1.000–1.030)
Total Protein, Urine: NEGATIVE
Urine Glucose: NEGATIVE
Urobilinogen, UA: 0.2 (ref 0.0–1.0)
pH: 6 (ref 5.0–8.0)

## 2021-12-19 LAB — LIPID PANEL
Cholesterol: 164 mg/dL (ref 0–200)
HDL: 40.7 mg/dL (ref >39.00)
NonHDL: 122.89
Total CHOL/HDL Ratio: 4
Triglycerides: 225 mg/dL — ABNORMAL HIGH (ref 0.0–149.0)
VLDL: 45 mg/dL — ABNORMAL HIGH (ref 0.0–40.0)

## 2021-12-19 LAB — PSA: PSA: 2.3 ng/mL (ref 0.10–4.00)

## 2021-12-19 LAB — HEMOGLOBIN A1C: Hgb A1c MFr Bld: 5.4 % (ref 4.6–6.5)

## 2021-12-19 LAB — TSH: TSH: 2.58 u[IU]/mL (ref 0.35–5.50)

## 2021-12-19 LAB — LDL CHOLESTEROL, DIRECT: Direct LDL: 89 mg/dL

## 2021-12-20 LAB — HEPATITIS C ANTIBODY: Hepatitis C Ab: NONREACTIVE

## 2023-09-06 ENCOUNTER — Ambulatory Visit
Admission: EM | Admit: 2023-09-06 | Discharge: 2023-09-06 | Disposition: A | Discharge status: Home / Self Care | Payer: Self-pay | Attending: Family Medicine | Admitting: Family Medicine

## 2023-09-06 DIAGNOSIS — J0141 Acute recurrent pansinusitis: Secondary | ICD-10-CM

## 2023-09-06 MED ORDER — DOXYCYCLINE HYCLATE 100 MG PO CAPS: 100.0000 mg | ORAL_CAPSULE | Freq: Two times a day (BID) | ORAL | 0 refills | Status: AC

## 2023-09-06 NOTE — Discharge Instructions (Addendum)
 Start doxycycline twice daily for 10 days.  Continue Flonase and allergy medicine.  Nasal rinses as tolerated.  Follow-up with your PCP if your symptoms do not improve.  Please go to the ER for any worsening symptoms.  Hope you feel better soon!

## 2023-09-06 NOTE — ED Triage Notes (Signed)
 Pt c/o nasal congestion, sneezing, nasal draiange x16days  Pt believed he had allergies  Pt has used Allegra, Benadryl, and Prescription Levocetirizine and Fluticasone last year when he had similar symptoms and states they did not work.    Pt states that he was given Amoxicillin, prednisone for symptoms and they did not work and symptoms only resolved when given Doxycycline.  Pt asks if he can have Doxycycline first to treat symptoms if given an antibiotic

## 2023-09-06 NOTE — ED Provider Notes (Signed)
 [facility]    CSN: [id number] Arrival date & time: 09/06/23  0865      History   Chief Complaint Chief Complaint  Patient presents with   Nasal Congestion    HPI Nathaniel Guzman is a 58 y.o. male presents for sinus pressure/pain.  Patient reports 2-1/2 weeks of sinus pressure/pain with sneezing, postnasal drip.  Reports he does have a history of seasonal allergies and has been taking his Flonase and allergy medicine with no improvement.  He is also been doing Benadryl as well.  Denies any fevers, cough, sore throat, body aches, shortness of breath no asthma or smoking history.  He has had similar symptoms in the past that required doxycycline for resolution.  No other concerns at this time.  HPI  Past Medical History:  Diagnosis Date   Tension headache, chronic     There are no active problems to display for this patient.   Past Surgical History:  Procedure Laterality Date   NO PAST SURGERIES         Home Medications    Prior to Admission medications   Medication Sig Start Date End Date Taking? Authorizing Provider  doxycycline (VIBRAMYCIN) 100 MG capsule Take 1 capsule (100 mg total) by mouth 2 (two) times daily. 09/06/23  Yes Radford Pax, NP    Family History Family History  Problem Relation Age of Onset   Lung cancer Cousin     Social History Social History   Tobacco Use   Smoking status: Never   Smokeless tobacco: Never  Vaping Use   Vaping status: Never Used  Substance Use Topics   Alcohol use: Yes   Drug use: No     Allergies   Hydrocodone and Sulfa antibiotics   Review of Systems Review of Systems  HENT:  Positive for postnasal drip, sinus pressure, sinus pain and sneezing.      Physical Exam Triage Vital Signs ED Triage Vitals  Encounter Vitals Group     BP 09/06/23 0828 (!) (P) 137/98     Systolic BP Percentile --      Diastolic BP Percentile --      Pulse Rate 09/06/23 0828 (P) 61     Resp 09/06/23 0828 (P) 16      Temp 09/06/23 0828 (P) 97.7 F (36.5 C)     Temp Source 09/06/23 0828 (P) Oral     SpO2 --      Weight 09/06/23 0826 138 lb 14.2 oz (63 kg)     Height 09/06/23 0826 5' (1.524 m)     Head Circumference --      Peak Flow --      Pain Score 09/06/23 0826 0     Pain Loc --      Pain Education --      Exclude from Growth Chart --    No data found.  Updated Vital Signs BP (!) (P) 137/98 (BP Location: Left Arm)   Pulse (P) 61   Temp (P) 97.7 F (36.5 C) (Oral)   Resp (P) 16   Ht 5' (1.524 m)   Wt 138 lb 14.2 oz (63 kg)   BMI 27.13 kg/m   Visual Acuity Right Eye Distance:   Left Eye Distance:   Bilateral Distance:    Right Eye Near:   Left Eye Near:    Bilateral Near:     Physical Exam Vitals and nursing note reviewed.  Constitutional:      General: He is not in  acute distress.    Appearance: Normal appearance. He is not ill-appearing or toxic-appearing.  HENT:     Head: Normocephalic and atraumatic.     Right Ear: Tympanic membrane and ear canal normal.     Left Ear: Tympanic membrane and ear canal normal.     Nose: Congestion present.     Right Turbinates: Swollen and pale.     Left Turbinates: Swollen and pale.     Right Sinus: No maxillary sinus tenderness or frontal sinus tenderness.     Left Sinus: No maxillary sinus tenderness or frontal sinus tenderness.     Mouth/Throat:     Mouth: Mucous membranes are moist.     Pharynx: Postnasal drip present. No oropharyngeal exudate or posterior oropharyngeal erythema.  Eyes:     Pupils: Pupils are equal, round, and reactive to light.  Cardiovascular:     Rate and Rhythm: Normal rate and regular rhythm.     Heart sounds: Normal heart sounds.  Pulmonary:     Effort: Pulmonary effort is normal.     Breath sounds: Normal breath sounds.  Musculoskeletal:     Cervical back: Normal range of motion and neck supple.  Lymphadenopathy:     Cervical: No cervical adenopathy.  Skin:    General: Skin is warm and dry.   Neurological:     General: No focal deficit present.     Mental Status: He is alert and oriented to person, place, and time.  Psychiatric:        Mood and Affect: Mood normal.        Behavior: Behavior normal.      UC Treatments / Results  Labs (all labs ordered are listed, but only abnormal results are displayed) Labs Reviewed - No data to display  EKG   Radiology No results found.  Procedures Procedures (including critical care time)  Medications Ordered in UC Medications - No data to display  Initial Impression / Assessment and Plan / UC Course  I have reviewed the triage vital signs and the nursing notes.  Pertinent labs & imaging results that were available during my care of the patient were reviewed by me and considered in my medical decision making (see chart for details).     Reviewed exam and symptoms with patient.  No red flags.  Will start doxycycline given length of symptoms.  Continue Flonase and allergy medicine.  Encourage nasal rinses.  PCP follow-up if symptoms do not improve.  ER precautions reviewed. Final Clinical Impressions(s) / UC Diagnoses   Final diagnoses:  Acute recurrent pansinusitis     Discharge Instructions      Start doxycycline twice daily for 10 days.  Continue Flonase and allergy medicine.  Nasal rinses as tolerated.  Follow-up with your PCP if your symptoms do not improve.  Please go to the ER for any worsening symptoms.  Hope you feel better soon!   ED Prescriptions     Medication Sig Dispense Auth. Provider   doxycycline (VIBRAMYCIN) 100 MG capsule Take 1 capsule (100 mg total) by mouth 2 (two) times daily. 20 capsule Radford Pax, NP      PDMP not reviewed this encounter.   Radford Pax, NP 09/06/23 [phone]

## 2023-09-21 ENCOUNTER — Ambulatory Visit
Admission: EM | Admit: 2023-09-21 | Discharge: 2023-09-21 | Disposition: A | Discharge status: Home / Self Care | Payer: Self-pay

## 2023-09-21 ENCOUNTER — Encounter: Payer: Self-pay | Admitting: Emergency Medicine

## 2023-09-21 DIAGNOSIS — J302 Other seasonal allergic rhinitis: Secondary | ICD-10-CM

## 2023-09-21 DIAGNOSIS — R0981 Nasal congestion: Secondary | ICD-10-CM

## 2023-09-21 NOTE — ED Triage Notes (Signed)
 Patient was seen 2 weeks ago and treated with antibiotic for sinus infection.  Patient states that he still has a stuffy nose.  Patient denies fevers.

## 2023-09-21 NOTE — Discharge Instructions (Addendum)
 Please take your daily allergy med (zyrtec,claritin, allegra, xyzal, etc). Take daily flonase nasal spray as label directed. May try saline nasal rinses, Drink plenty of water, avoid known triggers/allergens. Follow up with [facility]-call for appt.

## 2023-09-21 NOTE — ED Provider Notes (Signed)
 [facility]    CSN: [id number] Arrival date & time: 09/21/23  0911      History   Chief Complaint Chief Complaint  Patient presents with   Nasal Congestion    HPI Nathaniel Guzman is a 58 y.o. male.   Nathaniel Guzman is a 58 y.o. male presents for sinus pressure/pain.  Patient reports 1 month of sinus pressure with sneezing, postnasal drip.  Reports he does have a history of seasonal allergies and has taken Flonase and allergy medicine some,not daily.  Denies any fevers, cough, sore throat, body aches, shortness of breath, no asthma or smoking history.  He has had similar symptoms in the past that required doxycycline for resolution, was seen and treated on 09/06/2023 for sinusitis and placed on 10 day course of doxycycline in which symptoms improved,now with sneezing at times.   Pt has not seen ENT in past, will refer for further evaluation and management       The history is provided by the patient. No language interpreter was used.    Past Medical History:  Diagnosis Date   Tension headache, chronic     Patient Active Problem List   Diagnosis Date Noted   Seasonal allergies 09/21/2023   Nasal congestion 09/21/2023    Past Surgical History:  Procedure Laterality Date   NO PAST SURGERIES         Home Medications    Prior to Admission medications   Medication Sig Start Date End Date Taking? Authorizing Provider  doxycycline (VIBRAMYCIN) 100 MG capsule Take 1 capsule (100 mg total) by mouth 2 (two) times daily. 09/06/23   Radford Pax, NP    Family History Family History  Problem Relation Age of Onset   Lung cancer Cousin     Social History Social History   Tobacco Use   Smoking status: Never   Smokeless tobacco: Never  Vaping Use   Vaping status: Never Used  Substance Use Topics   Alcohol use: Yes   Drug use: No     Allergies   Hydrocodone and Sulfa antibiotics   Review of Systems Review of Systems  Constitutional:  Negative for  fever.  HENT:  Positive for congestion, postnasal drip, sinus pressure and sneezing. Negative for sinus pain and sore throat.   Respiratory:  Negative for cough.   All other systems reviewed and are negative.    Physical Exam Triage Vital Signs ED Triage Vitals  Encounter Vitals Group     BP      Systolic BP Percentile      Diastolic BP Percentile      Pulse      Resp      Temp      Temp src      SpO2      Weight      Height      Head Circumference      Peak Flow      Pain Score      Pain Loc      Pain Education      Exclude from Growth Chart    No data found.  Updated Vital Signs BP 123/76 (BP Location: Right Arm)   Pulse 74   Temp 98.5 F (36.9 C) (Oral)   Resp 15   Ht 5' (1.524 m)   Wt 138 lb 14.2 oz (63 kg)   SpO2 96%   BMI 27.13 kg/m   Visual Acuity Right Eye Distance:   Left Eye Distance:  Bilateral Distance:    Right Eye Near:   Left Eye Near:    Bilateral Near:     Physical Exam Vitals and nursing note reviewed.  HENT:     Head: Normocephalic.     Right Ear: Tympanic membrane is retracted.     Left Ear: Tympanic membrane is retracted.     Nose: Mucosal edema and congestion present.     Right Turbinates: Enlarged.     Left Turbinates: Enlarged.     Right Sinus: No maxillary sinus tenderness or frontal sinus tenderness.     Left Sinus: No maxillary sinus tenderness or frontal sinus tenderness.  Cardiovascular:     Rate and Rhythm: Normal rate and regular rhythm.     Heart sounds: Normal heart sounds.  Pulmonary:     Effort: Pulmonary effort is normal.     Breath sounds: Normal breath sounds and air entry.  Neurological:     General: No focal deficit present.     Mental Status: He is alert and oriented to person, place, and time.     GCS: GCS eye subscore is 4. GCS verbal subscore is 5. GCS motor subscore is 6.  Psychiatric:        Attention and Perception: Attention normal.        Mood and Affect: Mood normal.        Speech: Speech  normal.        Behavior: Behavior normal.      UC Treatments / Results  Labs (all labs ordered are listed, but only abnormal results are displayed) Labs Reviewed - No data to display  EKG   Radiology No results found.  Procedures Procedures (including critical care time)  Medications Ordered in UC Medications - No data to display  Initial Impression / Assessment and Plan / UC Course  I have reviewed the triage vital signs and the nursing notes.  Pertinent labs & imaging results that were available during my care of the patient were reviewed by me and considered in my medical decision making (see chart for details).    Discussed exam findings and plan of care with pt, Vital signs are normal at today's visit, afebrile. No sinus tenderness with palpation. No abx are indicated at this time, will refer to ENT, use flonase allergy med daily, saline nasal rinses.  Patient verbalized understanding to this provider   Ddx: Seasonal allergies Final Clinical Impressions(s) / UC Diagnoses   Final diagnoses:  Seasonal allergies  Nasal congestion     Discharge Instructions      Please take your daily allergy med (zyrtec,claritin, allegra, xyzal, etc). Take daily flonase nasal spray as label directed. May try saline nasal rinses, Drink plenty of water, avoid known triggers/allergens. Follow up with [facility]-call for appt.      ED Prescriptions   None    PDMP not reviewed this encounter.   Clancy Gourd, NP 09/21/23 [phone]

## 2023-12-13 ENCOUNTER — Ambulatory Visit (INDEPENDENT_AMBULATORY_CARE_PROVIDER_SITE_OTHER): Admitting: Student

## 2023-12-13 ENCOUNTER — Encounter: Payer: Self-pay | Admitting: Student

## 2023-12-13 VITALS — BP 106/70 | HR 55 | Ht 60.0 in | Wt 130.4 lb

## 2023-12-13 DIAGNOSIS — J302 Other seasonal allergic rhinitis: Secondary | ICD-10-CM

## 2023-12-13 DIAGNOSIS — Z0000 Encounter for general adult medical examination without abnormal findings: Secondary | ICD-10-CM

## 2023-12-13 DIAGNOSIS — E785 Hyperlipidemia, unspecified: Secondary | ICD-10-CM | POA: Insufficient documentation

## 2023-12-13 DIAGNOSIS — Z23 Encounter for immunization: Secondary | ICD-10-CM

## 2023-12-13 NOTE — Assessment & Plan Note (Signed)
 Labwork ordered today. Discussed healthy diet and exercise recommendations.  He will continue to follow up with doctors in [place] regarding colonoscopy. Does have history of 1 polyp noted in 2022, with normal repeat colonoscopy in 2023 per patient report Tdap administered today He reports completing shingles vaccine, I have asked he update this on MyChart if he can locate documentation.

## 2023-12-13 NOTE — Progress Notes (Deleted)
 Occionaly

## 2023-12-13 NOTE — Assessment & Plan Note (Signed)
 Ldl elevated to 109 when checked in 2024. Non medical management, he reports eating a healthy diet, avoiding red meat, fats/oils and sugary foods. Lipid panel today

## 2023-12-13 NOTE — Assessment & Plan Note (Signed)
 He take over second generation antihistamine as needed for this. Typically has symptoms between December and February. Not having symptoms at this time.

## 2023-12-13 NOTE — Progress Notes (Signed)
 New Patient Office Visit  Subjective    Patient ID: kahron kauth, male    DOB: 11-15-1965  Age: 58 y.o. MRN: [id number]  CC:  Chief Complaint  Patient presents with   Establish Care    Routine check up, no concerns    HPI Nathaniel Guzman 58 year old person without significant past medical history presents for a physical.  Denies any acute complaints.   Reports colonoscopy in [place] in 2022 with 1 polyp. He went back to [place] in 2023 for repeat colonscopy and reports this was normal. Thinks he was told to return in 5 years. He would like to continue colon cancer screening in [place].  Does strength training every morning and walks with wife for 30-40 minutes 4-5 times a week.   Reports he complete shingles vaccine at [facility].  Outpatient Encounter Medications as of 12/13/2023  Medication Sig   levocetirizine (XYZAL) 5 MG tablet Take 5 mg by mouth as needed.   doxycycline  (VIBRAMYCIN ) 100 MG capsule Take 1 capsule (100 mg total) by mouth 2 (two) times daily. (Patient not taking: Reported on 12/13/2023)   No facility-administered encounter medications on file as of 12/13/2023.    Past Medical History:  Diagnosis Date   Tension headache, chronic     Past Surgical History:  Procedure Laterality Date   NO PAST SURGERIES      Family History  Problem Relation Age of Onset   Breast cancer Sister    Lung cancer Cousin     Social History   Socioeconomic History   Marital status: Single    Spouse name: Not on file   Number of children: Not on file   Years of education: Not on file   Highest education level: Not on file  Occupational History   Not on file  Tobacco Use   Smoking status: Never   Smokeless tobacco: Never  Vaping Use   Vaping status: Never Used  Substance and Sexual Activity   Alcohol use: Yes    Comment: x 2 a month   Drug use: No   Sexual activity: Not on file  Other Topics Concern   Not on file  Social History Narrative   Lives with wife and son -     Social Drivers of Health   Financial Resource Strain: Low Risk  (08/24/2022)   Received from [facility]   Overall Financial Resource Strain (CARDIA)    Difficulty of Paying Living Expenses: Not hard at all  Food Insecurity: No Food Insecurity (12/13/2023)   Hunger Vital Sign    Worried About Running Out of Food in the Last Year: Never true    Ran Out of Food in the Last Year: Never true  Transportation Needs: No Transportation Needs (12/13/2023)   PRAPARE - Administrator, Civil Service (Medical): No    Lack of Transportation (Non-Medical): No  Physical Activity: Unknown (12/26/2022)   Received from [facility]   Exercise Vital Sign    On average, how many days per week do you engage in moderate to strenuous exercise (like a brisk walk)?: 0 days    Minutes of Exercise per Session: Not on file  Stress: No Stress Concern Present (12/26/2022)   Received from [facility] - Occupational Stress Questionnaire    Feeling of Stress : Not at all  Social Connections: Socially Integrated (12/26/2022)   Received from [facility]   Social Network  How would you rate your social network (family, work, friends)?: Good participation with social networks  Intimate Partner Violence: Not At Risk (12/13/2023)   Humiliation, Afraid, Rape, and Kick questionnaire    Fear of Current or Ex-Partner: No    Emotionally Abused: No    Physically Abused: No    Sexually Abused: No    ROS Refer to HPI    Objective   BP 106/70   Pulse (!) 55   Ht 5' (1.524 m)   Wt 130 lb 6 oz (59.1 kg)   SpO2 97%   BMI 25.46 kg/m   Physical Exam Constitutional:      Appearance: Normal appearance.  HENT:     Mouth/Throat:     Mouth: Mucous membranes are moist.     Pharynx: Oropharynx is clear.   Eyes:     Extraocular Movements: Extraocular movements intact.     Pupils: Pupils are equal, round, and reactive to light.    Cardiovascular:     Rate and  Rhythm: Normal rate and regular rhythm.  Pulmonary:     Effort: Pulmonary effort is normal.     Breath sounds: No rhonchi or rales.  Abdominal:     General: Abdomen is flat. Bowel sounds are normal. There is no distension.     Palpations: Abdomen is soft.     Tenderness: There is no abdominal tenderness.   Musculoskeletal:        General: Normal range of motion.     Cervical back: No tenderness.     Right lower leg: No edema.     Left lower leg: No edema.   Skin:    General: Skin is warm and dry.     Capillary Refill: Capillary refill takes less than 2 seconds.   Neurological:     General: No focal deficit present.     Mental Status: He is alert and oriented to person, place, and time.   Psychiatric:        Mood and Affect: Mood normal.        Behavior: Behavior normal.        12/13/2023   10:03 AM 12/19/2021    8:02 AM  Depression screen PHQ 2/9  Decreased Interest 0 0  Down, Depressed, Hopeless 0 0  PHQ - 2 Score 0 0  Altered sleeping 0   Tired, decreased energy 0   Change in appetite 0   Feeling bad or failure about yourself  0   Trouble concentrating 0   Moving slowly or fidgety/restless 0   Suicidal thoughts 0   PHQ-9 Score 0   Difficult doing work/chores Not difficult at all       12/13/2023   10:03 AM  GAD 7 : Generalized Anxiety Score  Nervous, Anxious, on Edge 0  Control/stop worrying 0  Worry too much - different things 0  Trouble relaxing 0  Restless 0  Easily annoyed or irritable 0  Afraid - awful might happen 0  Total GAD 7 Score 0  Anxiety Difficulty Not difficult at all    Last CBC Lab Results  Component Value Date   WBC 4.9 12/19/2021   HGB 15.0 12/19/2021   HCT 44.1 12/19/2021   MCV 88.5 12/19/2021   MCH 29.3 06/10/2013   RDW 13.3 12/19/2021   PLT 169.0 12/19/2021   Last metabolic panel Lab Results  Component Value Date   GLUCOSE 97 12/19/2021   NA 140 12/19/2021   K 4.0 12/19/2021   CL 103  12/19/2021   CO2 31 12/19/2021    BUN 12 12/19/2021   CREATININE 1.11 12/19/2021   GFR 75.37 12/19/2021   CALCIUM 9.2 12/19/2021   PROT 6.6 12/19/2021   ALBUMIN 4.4 12/19/2021   BILITOT 1.1 12/19/2021   ALKPHOS 55 12/19/2021   AST 18 12/19/2021   ALT 15 12/19/2021   Last lipids Lab Results  Component Value Date   CHOL 164 12/19/2021   HDL 40.70 12/19/2021   LDLCALC 105 (H) 11/14/2017   LDLDIRECT 89.0 12/19/2021   TRIG 225.0 (H) 12/19/2021   CHOLHDL 4 12/19/2021   Last hemoglobin A1c Lab Results  Component Value Date   HGBA1C 5.4 12/19/2021   Last thyroid  functions Lab Results  Component Value Date   TSH 2.58 12/19/2021        Assessment & Plan:  Annual physical exam Assessment & Plan: Labwork ordered today. Discussed healthy diet and exercise recommendations.  He will continue to follow up with doctors in [place] regarding colonoscopy. Does have history of 1 polyp noted in 2022, with normal repeat colonoscopy in 2023 per patient report Tdap administered today He reports completing shingles vaccine, I have asked he update this on MyChart if he can locate documentation.  Orders: -     CBC with Differential/Platelet -     Comprehensive metabolic panel with GFR -     Lipid panel -     TSH -     Hemoglobin A1c  Immunization due -     Tdap vaccine greater than or equal to 7yo IM  Hyperlipidemia, unspecified hyperlipidemia type Assessment & Plan: Ldl elevated to 109 when checked in 2024. Non medical management, he reports eating a healthy diet, avoiding red meat, fats/oils and sugary foods. Lipid panel today   Seasonal allergies Assessment & Plan: He take over second generation antihistamine as needed for this. Typically has symptoms between December and February. Not having symptoms at this time.      Return in about 1 year (around 12/12/2024).   Barnetta Liberty, MD

## 2023-12-14 ENCOUNTER — Ambulatory Visit: Payer: Self-pay | Admitting: Student

## 2023-12-14 LAB — CBC WITH DIFFERENTIAL/PLATELET
Basophils Absolute: 0 10*3/uL (ref 0.0–0.2)
Basos: 1 %
EOS (ABSOLUTE): 0.1 10*3/uL (ref 0.0–0.4)
Eos: 1 %
Hematocrit: 45.3 % (ref 37.5–51.0)
Hemoglobin: 14.5 g/dL (ref 13.0–17.7)
Immature Grans (Abs): 0 10*3/uL (ref 0.0–0.1)
Immature Granulocytes: 0 %
Lymphocytes Absolute: 1.5 10*3/uL (ref 0.7–3.1)
Lymphs: 33 %
MCH: 29.5 pg (ref 26.6–33.0)
MCHC: 32 g/dL (ref 31.5–35.7)
MCV: 92 fL (ref 79–97)
Monocytes Absolute: 0.4 10*3/uL (ref 0.1–0.9)
Monocytes: 10 %
Neutrophils Absolute: 2.4 10*3/uL (ref 1.4–7.0)
Neutrophils: 55 %
Platelets: 187 10*3/uL (ref 150–450)
RBC: 4.92 x10E6/uL (ref 4.14–5.80)
RDW: 12.7 % (ref 11.6–15.4)
WBC: 4.4 10*3/uL (ref 3.4–10.8)

## 2023-12-14 LAB — COMPREHENSIVE METABOLIC PANEL WITH GFR
ALT: 11 IU/L (ref 0–44)
AST: 17 IU/L (ref 0–40)
Albumin: 4.4 g/dL (ref 3.8–4.9)
Alkaline Phosphatase: 69 IU/L (ref 44–121)
BUN/Creatinine Ratio: 18 (ref 9–20)
BUN: 17 mg/dL (ref 6–24)
Bilirubin Total: 0.9 mg/dL (ref 0.0–1.2)
CO2: 23 mmol/L (ref 20–29)
Calcium: 9.1 mg/dL (ref 8.7–10.2)
Chloride: 104 mmol/L (ref 96–106)
Creatinine, Ser: 0.97 mg/dL (ref 0.76–1.27)
Globulin, Total: 2.2 g/dL (ref 1.5–4.5)
Glucose: 92 mg/dL (ref 70–99)
Potassium: 4.1 mmol/L (ref 3.5–5.2)
Sodium: 143 mmol/L (ref 134–144)
Total Protein: 6.6 g/dL (ref 6.0–8.5)
eGFR: 92 mL/min/{1.73_m2} (ref >59)

## 2023-12-14 LAB — LIPID PANEL
Chol/HDL Ratio: 3.8 ratio (ref 0.0–5.0)
Cholesterol, Total: 158 mg/dL (ref 100–199)
HDL: 42 mg/dL (ref >39)
LDL Chol Calc (NIH): 103 mg/dL — ABNORMAL HIGH (ref 0–99)
Triglycerides: 66 mg/dL (ref 0–149)
VLDL Cholesterol Cal: 13 mg/dL (ref 5–40)

## 2023-12-14 LAB — HEMOGLOBIN A1C
Est. average glucose Bld gHb Est-mCnc: 114 mg/dL
Hgb A1c MFr Bld: 5.6 % (ref 4.8–5.6)

## 2023-12-14 LAB — TSH: TSH: 1.85 u[IU]/mL (ref 0.450–4.500)

## 2024-12-18 ENCOUNTER — Encounter: Admitting: Student
